# Patient Record
Sex: Female | Born: 1945 | Race: White | Hispanic: No | Marital: Single | Smoking: Former smoker
Health system: Southern US, Community
[De-identification: ages and names within clinical notes are randomized; demographics above are authoritative.]

## PROBLEM LIST (undated history)

## (undated) DIAGNOSIS — M1990 Unspecified osteoarthritis, unspecified site: Secondary | ICD-10-CM

## (undated) DIAGNOSIS — E785 Hyperlipidemia, unspecified: Secondary | ICD-10-CM

## (undated) DIAGNOSIS — M8580 Other specified disorders of bone density and structure, unspecified site: Secondary | ICD-10-CM

## (undated) DIAGNOSIS — C801 Malignant (primary) neoplasm, unspecified: Secondary | ICD-10-CM

## (undated) DIAGNOSIS — I10 Essential (primary) hypertension: Secondary | ICD-10-CM

## (undated) DIAGNOSIS — T7840XA Allergy, unspecified, initial encounter: Secondary | ICD-10-CM

## (undated) HISTORY — PX: POLYPECTOMY: SHX149

## (undated) HISTORY — DX: Other specified disorders of bone density and structure, unspecified site: M85.80

## (undated) HISTORY — DX: Essential (primary) hypertension: I10

## (undated) HISTORY — PX: CATARACT EXTRACTION, BILATERAL: SHX1313

## (undated) HISTORY — DX: Hyperlipidemia, unspecified: E78.5

## (undated) HISTORY — DX: Unspecified osteoarthritis, unspecified site: M19.90

## (undated) HISTORY — DX: Allergy, unspecified, initial encounter: T78.40XA

## (undated) HISTORY — DX: Malignant (primary) neoplasm, unspecified: C80.1

## (undated) HISTORY — PX: BASAL CELL CARCINOMA EXCISION: SHX1214

---

## 2005-08-22 HISTORY — PX: OTHER SURGICAL HISTORY: SHX169

## 2006-05-01 ENCOUNTER — Ambulatory Visit: Payer: Self-pay | Admitting: Family Medicine

## 2006-05-18 ENCOUNTER — Other Ambulatory Visit: Admission: RE | Admit: 2006-05-18 | Discharge: 2006-05-18 | Payer: Self-pay | Admitting: Family Medicine

## 2006-05-18 ENCOUNTER — Ambulatory Visit: Payer: Self-pay | Admitting: Family Medicine

## 2006-06-22 ENCOUNTER — Ambulatory Visit: Payer: Self-pay | Admitting: Family Medicine

## 2006-08-25 ENCOUNTER — Ambulatory Visit: Payer: Self-pay | Admitting: Family Medicine

## 2006-08-25 LAB — CONVERTED CEMR LAB
ALT: 40 units/L (ref 0–40)
AST: 28 units/L (ref 0–37)
HDL: 31.9 mg/dL — ABNORMAL LOW (ref >39.0)
Triglyceride fasting, serum: 261 mg/dL (ref 0–149)
VLDL: 52 mg/dL — ABNORMAL HIGH (ref 0–40)

## 2006-08-28 ENCOUNTER — Encounter: Admission: RE | Admit: 2006-08-28 | Discharge: 2006-08-28 | Payer: Self-pay | Admitting: Family Medicine

## 2006-11-24 ENCOUNTER — Ambulatory Visit: Payer: Self-pay | Admitting: Family Medicine

## 2006-11-24 LAB — CONVERTED CEMR LAB
ALT: 23 units/L (ref 0–40)
AST: 21 units/L (ref 0–37)
Alkaline Phosphatase: 40 units/L (ref 39–117)
Cholesterol: 138 mg/dL (ref 0–200)
LDL Cholesterol: 78 mg/dL (ref 0–99)
Total Protein: 6.5 g/dL (ref 6.0–8.3)

## 2006-12-01 ENCOUNTER — Ambulatory Visit: Payer: Self-pay | Admitting: Family Medicine

## 2007-04-17 DIAGNOSIS — M899 Disorder of bone, unspecified: Secondary | ICD-10-CM | POA: Insufficient documentation

## 2007-04-17 DIAGNOSIS — I10 Essential (primary) hypertension: Secondary | ICD-10-CM | POA: Insufficient documentation

## 2007-04-17 DIAGNOSIS — M949 Disorder of cartilage, unspecified: Secondary | ICD-10-CM

## 2007-04-17 DIAGNOSIS — E785 Hyperlipidemia, unspecified: Secondary | ICD-10-CM | POA: Insufficient documentation

## 2007-05-21 ENCOUNTER — Ambulatory Visit: Payer: Self-pay | Admitting: Family Medicine

## 2007-05-21 LAB — CONVERTED CEMR LAB
ALT: 18 units/L (ref 0–35)
Alkaline Phosphatase: 34 units/L — ABNORMAL LOW (ref 39–117)
Bilirubin, Direct: 0.2 mg/dL (ref 0.0–0.3)
CO2: 31 meq/L (ref 19–32)
Cholesterol: 155 mg/dL (ref 0–200)
Eosinophils Absolute: 0.1 10*3/uL (ref 0.0–0.6)
Eosinophils Relative: 0.7 % (ref 0.0–5.0)
GFR calc Af Amer: 93 mL/min
Glucose, Bld: 95 mg/dL (ref 70–99)
HDL: 43.3 mg/dL (ref >39.0)
Hemoglobin: 12.9 g/dL (ref 12.0–15.0)
LDL Cholesterol: 99 mg/dL (ref 0–99)
Lymphocytes Relative: 36.8 % (ref 12.0–46.0)
MCHC: 35.7 g/dL (ref 30.0–36.0)
MCV: 88.4 fL (ref 78.0–100.0)
Monocytes Relative: 8.3 % (ref 3.0–11.0)
Neutro Abs: 4.1 10*3/uL (ref 1.4–7.7)
Neutrophils Relative %: 53.2 % (ref 43.0–77.0)
Potassium: 4.5 meq/L (ref 3.5–5.1)
RDW: 11.3 % — ABNORMAL LOW (ref 11.5–14.6)
Sodium: 142 meq/L (ref 135–145)
Total Bilirubin: 0.8 mg/dL (ref 0.3–1.2)
Total Protein: 6.3 g/dL (ref 6.0–8.3)

## 2007-05-28 ENCOUNTER — Encounter: Payer: Self-pay | Admitting: Internal Medicine

## 2007-05-28 ENCOUNTER — Ambulatory Visit: Payer: Self-pay | Admitting: Family Medicine

## 2007-05-28 DIAGNOSIS — Z85828 Personal history of other malignant neoplasm of skin: Secondary | ICD-10-CM | POA: Insufficient documentation

## 2007-05-30 LAB — CONVERTED CEMR LAB: Vit D, 1,25-Dihydroxy: 19 — ABNORMAL LOW (ref 30–89)

## 2007-07-30 ENCOUNTER — Ambulatory Visit: Payer: Self-pay | Admitting: Family Medicine

## 2007-08-31 ENCOUNTER — Encounter: Admission: RE | Admit: 2007-08-31 | Discharge: 2007-08-31 | Payer: Self-pay | Admitting: Family Medicine

## 2007-12-06 ENCOUNTER — Ambulatory Visit: Payer: Self-pay | Admitting: Family Medicine

## 2007-12-10 LAB — CONVERTED CEMR LAB
ALT: 30 units/L (ref 0–35)
Bilirubin, Direct: 0.1 mg/dL (ref 0.0–0.3)
HDL: 33.3 mg/dL — ABNORMAL LOW (ref >39.0)
Total Bilirubin: 0.6 mg/dL (ref 0.3–1.2)
Total CHOL/HDL Ratio: 5.4
VLDL: 33 mg/dL (ref 0–40)

## 2008-05-27 ENCOUNTER — Ambulatory Visit: Payer: Self-pay | Admitting: Family Medicine

## 2008-05-27 LAB — CONVERTED CEMR LAB
Bilirubin Urine: NEGATIVE
Glucose, Urine, Semiquant: NEGATIVE
Specific Gravity, Urine: 1.02
Urobilinogen, UA: 0.2

## 2008-05-29 LAB — CONVERTED CEMR LAB
ALT: 33 units/L (ref 0–35)
AST: 28 units/L (ref 0–37)
Alkaline Phosphatase: 39 units/L (ref 39–117)
Basophils Absolute: 0.1 10*3/uL (ref 0.0–0.1)
Bilirubin, Direct: 0.1 mg/dL (ref 0.0–0.3)
CO2: 26 meq/L (ref 19–32)
Cholesterol: 161 mg/dL (ref 0–200)
Creatinine, Ser: 0.8 mg/dL (ref 0.4–1.2)
HDL: 33.6 mg/dL — ABNORMAL LOW (ref >39.0)
Hemoglobin: 13.3 g/dL (ref 12.0–15.0)
LDL Cholesterol: 90 mg/dL (ref 0–99)
Lymphocytes Relative: 37.1 % (ref 12.0–46.0)
MCHC: 35.4 g/dL (ref 30.0–36.0)
Monocytes Relative: 7.2 % (ref 3.0–12.0)
Neutro Abs: 3.6 10*3/uL (ref 1.4–7.7)
Platelets: 367 10*3/uL (ref 150–400)
Potassium: 3.8 meq/L (ref 3.5–5.1)
RDW: 11.5 % (ref 11.5–14.6)
Sodium: 141 meq/L (ref 135–145)
Total Bilirubin: 0.5 mg/dL (ref 0.3–1.2)
Total CHOL/HDL Ratio: 4.8
Triglycerides: 185 mg/dL — ABNORMAL HIGH (ref 0–149)
VLDL: 37 mg/dL (ref 0–40)

## 2008-06-03 ENCOUNTER — Ambulatory Visit: Payer: Self-pay | Admitting: Family Medicine

## 2008-06-03 ENCOUNTER — Encounter: Payer: Self-pay | Admitting: Family Medicine

## 2008-06-03 ENCOUNTER — Other Ambulatory Visit: Admission: RE | Admit: 2008-06-03 | Discharge: 2008-06-03 | Payer: Self-pay | Admitting: Family Medicine

## 2008-06-10 ENCOUNTER — Ambulatory Visit: Payer: Self-pay | Admitting: Internal Medicine

## 2008-06-10 ENCOUNTER — Encounter: Payer: Self-pay | Admitting: Family Medicine

## 2008-09-03 ENCOUNTER — Encounter: Admission: RE | Admit: 2008-09-03 | Discharge: 2008-09-03 | Payer: Self-pay | Admitting: Family Medicine

## 2008-12-01 ENCOUNTER — Ambulatory Visit: Payer: Self-pay | Admitting: Family Medicine

## 2008-12-01 DIAGNOSIS — J309 Allergic rhinitis, unspecified: Secondary | ICD-10-CM | POA: Insufficient documentation

## 2008-12-01 DIAGNOSIS — E559 Vitamin D deficiency, unspecified: Secondary | ICD-10-CM | POA: Insufficient documentation

## 2008-12-03 ENCOUNTER — Encounter: Payer: Self-pay | Admitting: Family Medicine

## 2008-12-03 LAB — CONVERTED CEMR LAB
Cholesterol: 142 mg/dL (ref 0–200)
HDL: 34.9 mg/dL — ABNORMAL LOW (ref >39.00)
Total CHOL/HDL Ratio: 4
Triglycerides: 118 mg/dL (ref 0.0–149.0)
Vit D, 25-Hydroxy: 42 ng/mL (ref 30–89)

## 2009-03-03 ENCOUNTER — Telehealth: Payer: Self-pay | Admitting: Family Medicine

## 2009-06-01 ENCOUNTER — Ambulatory Visit: Payer: Self-pay | Admitting: Family Medicine

## 2009-06-01 LAB — CONVERTED CEMR LAB
Bilirubin Urine: NEGATIVE
Blood in Urine, dipstick: NEGATIVE
Glucose, Urine, Semiquant: NEGATIVE
Ketones, urine, test strip: NEGATIVE
Nitrite: NEGATIVE
Protein, U semiquant: NEGATIVE
Specific Gravity, Urine: 1.015
Urobilinogen, UA: 0.2
WBC Urine, dipstick: NEGATIVE
pH: 7

## 2009-06-02 LAB — CONVERTED CEMR LAB: Vit D, 25-Hydroxy: 29 ng/mL — ABNORMAL LOW (ref 30–89)

## 2009-06-03 LAB — CONVERTED CEMR LAB
Albumin: 4.1 g/dL (ref 3.5–5.2)
BUN: 15 mg/dL (ref 6–23)
Basophils Relative: 0.9 % (ref 0.0–3.0)
CO2: 23 meq/L (ref 19–32)
Calcium: 9.4 mg/dL (ref 8.4–10.5)
Creatinine, Ser: 0.9 mg/dL (ref 0.4–1.2)
Eosinophils Absolute: 0.1 10*3/uL (ref 0.0–0.7)
GFR calc non Af Amer: 66.97 mL/min (ref >60)
Glucose, Bld: 88 mg/dL (ref 70–99)
HDL: 30.4 mg/dL — ABNORMAL LOW (ref >39.00)
Lymphocytes Relative: 37.2 % (ref 12.0–46.0)
Lymphs Abs: 2.5 10*3/uL (ref 0.7–4.0)
MCHC: 34.9 g/dL (ref 30.0–36.0)
MCV: 92.9 fL (ref 78.0–100.0)
Monocytes Absolute: 0.6 10*3/uL (ref 0.1–1.0)
Neutro Abs: 3.4 10*3/uL (ref 1.4–7.7)
Neutrophils Relative %: 51.3 % (ref 43.0–77.0)
RBC: 4.29 M/uL (ref 3.87–5.11)
RDW: 11.5 % (ref 11.5–14.6)
TSH: 1.29 microintl units/mL (ref 0.35–5.50)
Total Protein: 6.7 g/dL (ref 6.0–8.3)
Triglycerides: 196 mg/dL — ABNORMAL HIGH (ref 0.0–149.0)
VLDL: 39.2 mg/dL (ref 0.0–40.0)

## 2009-06-05 ENCOUNTER — Ambulatory Visit: Payer: Self-pay | Admitting: Family Medicine

## 2009-09-10 ENCOUNTER — Encounter: Admission: RE | Admit: 2009-09-10 | Discharge: 2009-09-10 | Payer: Self-pay | Admitting: Family Medicine

## 2009-12-04 ENCOUNTER — Ambulatory Visit: Payer: Self-pay | Admitting: Family Medicine

## 2009-12-07 LAB — CONVERTED CEMR LAB
ALT: 35 units/L (ref 0–35)
AST: 27 units/L (ref 0–37)
Alkaline Phosphatase: 38 units/L — ABNORMAL LOW (ref 39–117)
Cholesterol: 147 mg/dL (ref 0–200)
HDL: 38.5 mg/dL — ABNORMAL LOW (ref >39.00)
Total CHOL/HDL Ratio: 4
Triglycerides: 167 mg/dL — ABNORMAL HIGH (ref 0.0–149.0)

## 2010-06-07 ENCOUNTER — Encounter: Payer: Self-pay | Admitting: Family Medicine

## 2010-06-07 ENCOUNTER — Ambulatory Visit: Payer: Self-pay | Admitting: Family Medicine

## 2010-06-07 DIAGNOSIS — G43909 Migraine, unspecified, not intractable, without status migrainosus: Secondary | ICD-10-CM | POA: Insufficient documentation

## 2010-06-10 LAB — CONVERTED CEMR LAB
BUN: 21 mg/dL (ref 6–23)
Basophils Absolute: 0 10*3/uL (ref 0.0–0.1)
Calcium: 10.1 mg/dL (ref 8.4–10.5)
Cholesterol: 152 mg/dL (ref 0–200)
Creatinine, Ser: 0.8 mg/dL (ref 0.4–1.2)
Eosinophils Absolute: 0.1 10*3/uL (ref 0.0–0.7)
Eosinophils Relative: 1.4 % (ref 0.0–5.0)
GFR calc non Af Amer: 79.93 mL/min (ref >60)
Glucose, Bld: 104 mg/dL — ABNORMAL HIGH (ref 70–99)
HCT: 39.2 % (ref 36.0–46.0)
HDL: 37 mg/dL — ABNORMAL LOW (ref >39.00)
LDL Cholesterol: 88 mg/dL (ref 0–99)
Lymphocytes Relative: 33.2 % (ref 12.0–46.0)
Lymphs Abs: 2.8 10*3/uL (ref 0.7–4.0)
Monocytes Absolute: 0.6 10*3/uL (ref 0.1–1.0)
Monocytes Relative: 7.3 % (ref 3.0–12.0)
Neutrophils Relative %: 57.6 % (ref 43.0–77.0)
Platelets: 337 10*3/uL (ref 150.0–400.0)
RDW: 12.4 % (ref 11.5–14.6)
Total Bilirubin: 0.5 mg/dL (ref 0.3–1.2)
Total Protein: 6.6 g/dL (ref 6.0–8.3)
Triglycerides: 134 mg/dL (ref 0.0–149.0)
VLDL: 26.8 mg/dL (ref 0.0–40.0)
WBC: 8.5 10*3/uL (ref 4.5–10.5)

## 2010-09-14 ENCOUNTER — Encounter
Admission: RE | Admit: 2010-09-14 | Discharge: 2010-09-14 | Payer: Self-pay | Source: Home / Self Care | Attending: Family Medicine | Admitting: Family Medicine

## 2010-09-21 NOTE — Assessment & Plan Note (Signed)
Summary: emp/pap if needed/pt coming in fasting/cjr   Vital Signs:  Patient profile:   65 year old female Menstrual status:  postmenopausal Height:      67 inches Weight:      168 pounds O2 Sat:      98 % Temp:     98.4 degrees F Pulse rate:   82 / minute BP sitting:   130 / 86  (left arm) Cuff size:   regular  Vitals Entered By: Pura Spice, RN (June 07, 2010 9:20 AM) CC: cpx no pap      Menstrual Status postmenopausal Last PAP Result NEGATIVE FOR INTRAEPITHELIAL LESIONS OR MALIGNANCY.   History of Present Illness: 65 yr old female for a cpx. She feels well in general. She admits to not exercising as much as before and to gaining some weight.   Preventive Screening-Counseling & Management  Alcohol-Tobacco     Smoking Status: quit > 6 months     Year Quit: 1980  Allergies (verified): No Known Drug Allergies  Past History:  Past Medical History: Hyperlipidemia Hypertension Osteopenia, last DEXA on 06-10-08 sees Dr. Para Skeans for skin checks migraine HAs Vit. D deficiency  Past Surgical History: Reviewed history from 06/03/2008 and no changes required. basal cell removed from face 2001 basal cell removed from back  2007 colonoscopy 12-15-05 in [place], [place], repeat in 5 yrs  Family History: Reviewed history from 05/28/2007 and no changes required. Family History of Arthritis Family History of CAD Female 1st degree relative <50 Family History Diabetes 1st degree relative Family History Hypertension Family History Lung cancer Family History Osteoporosis brother died of CHF  Social History: Reviewed history from 05/28/2007 and no changes required. Retired Single Never Smoked Alcohol use-yes Smoking Status:  quit > 6 months  Review of Systems  The patient denies anorexia, fever, weight loss, vision loss, decreased hearing, hoarseness, chest pain, syncope, dyspnea on exertion, peripheral edema, prolonged cough, headaches, hemoptysis, abdominal pain,  melena, hematochezia, severe indigestion/heartburn, hematuria, incontinence, genital sores, muscle weakness, suspicious skin lesions, transient blindness, difficulty walking, depression, unusual weight change, abnormal bleeding, enlarged lymph nodes, angioedema, breast masses, and testicular masses.         Flu Vaccine Consent Questions     Do you have a history of severe allergic reactions to this vaccine? no    Any prior history of allergic reactions to egg and/or gelatin? no    Do you have a sensitivity to the preservative Thimersol? no    Do you have a past history of Guillan-Barre Syndrome? no    Do you currently have an acute febrile illness? no    Have you ever had a severe reaction to latex? no    Vaccine information given and explained to patient? yes    Are you currently pregnant? no    Lot Number:[id number]   Exp Date:02/19/2011   Site Given  Left Deltoid IM Pura Spice, RN  June 07, 2010 9:30 AM   Physical Exam  General:  overweight-appearing.   Head:  Normocephalic and atraumatic without obvious abnormalities. No apparent alopecia or balding. Eyes:  No corneal or conjunctival inflammation noted. EOMI. Perrla. Funduscopic exam benign, without hemorrhages, exudates or papilledema. Vision grossly normal. Ears:  External ear exam shows no significant lesions or deformities.  Otoscopic examination reveals clear canals, tympanic membranes are intact bilaterally without bulging, retraction, inflammation or discharge. Hearing is grossly normal bilaterally. Nose:  External nasal examination shows no deformity or inflammation. Nasal mucosa are pink  and moist without lesions or exudates. Mouth:  Oral mucosa and oropharynx without lesions or exudates.  Teeth in good repair. Neck:  No deformities, masses, or tenderness noted. Chest Wall:  No deformities, masses, or tenderness noted. Breasts:  No mass, nodules, thickening, tenderness, bulging, retraction, inflamation, nipple discharge  or skin changes noted.   Lungs:  Normal respiratory effort, chest expands symmetrically. Lungs are clear to auscultation, no crackles or wheezes. Heart:  Normal rate and regular rhythm. S1 and S2 normal without gallop, murmur, click, rub or other extra sounds. EKG  normal Abdomen:  Bowel sounds positive,abdomen soft and non-tender without masses, organomegaly or hernias noted. Rectal:  No external abnormalities noted. Normal sphincter tone. No rectal masses or tenderness. Heme neg.  Genitalia:  normal introitus and no external lesions.   Msk:  No deformity or scoliosis noted of thoracic or lumbar spine.   Pulses:  R and L carotid,radial,femoral,dorsalis pedis and posterior tibial pulses are full and equal bilaterally Extremities:  No clubbing, cyanosis, edema, or deformity noted with normal full range of motion of all joints.   Neurologic:  No cranial nerve deficits noted. Station and gait are normal. Plantar reflexes are down-going bilaterally. DTRs are symmetrical throughout. Sensory, motor and coordinative functions appear intact. Skin:  Intact without suspicious lesions or rashes Cervical Nodes:  No lymphadenopathy noted Axillary Nodes:  No palpable lymphadenopathy Inguinal Nodes:  No significant adenopathy Psych:  Cognition and judgment appear intact. Alert and cooperative with normal attention span and concentration. No apparent delusions, illusions, hallucinations   Impression & Recommendations:  Problem # 1:  VITAMIN D DEFICIENCY (ICD-268.9)  Orders: T-Vitamin D (25-Hydroxy) (14782-95621)  Problem # 2:  HYPERTENSION (ICD-401.9)  Her updated medication list for this problem includes:    Maxzide-25 37.5-25 Mg Tabs (Triamterene-hctz) .Marland Kitchen... Take 1 tablet by mouth once a day  Orders: EKG w/ Interpretation (93000) Venipuncture (30865) TLB-Lipid Panel (80061-LIPID) TLB-BMP (Basic Metabolic Panel-BMET) (80048-METABOL) TLB-CBC Platelet - w/Differential (85025-CBCD) TLB-Hepatic/Liver  Function Pnl (80076-HEPATIC) TLB-TSH (Thyroid Stimulating Hormone) (84443-TSH) UA Dipstick w/Micro (automated) (81001)  Problem # 3:  HYPERLIPIDEMIA (ICD-272.4)  Her updated medication list for this problem includes:    Zocor 40 Mg Tabs (Simvastatin) .Marland Kitchen... Take 1 tablet by mouth once a day    Fenofibrate 160 Mg Tabs (Fenofibrate) .Marland Kitchen... 1 by mouth once daily  Problem # 4:  MIGRAINE HEADACHE (ICD-346.90)  The following medications were removed from the medication list:    Midrin 325-65-100 Mg Caps (Apap-isometheptene-dichloral) .Marland Kitchen... As needed for ha    Tylenol Extra Strength 500 Mg Tabs (Acetaminophen) .Marland Kitchen... As needed Her updated medication list for this problem includes:    Aspirin 81 Mg Tbec (Aspirin) ..... Once daily    Imitrex 100 Mg Tabs (Sumatriptan succinate) .Marland Kitchen... As needed for ha  Problem # 5:  OSTEOPENIA (ICD-733.90)  The following medications were removed from the medication list:    Fosamax 70 Mg Tabs (Alendronate sodium) ..... Every week    Vitamin D (ergocalciferol) 50000 Unit Caps (Ergocalciferol) ..... One q week Her updated medication list for this problem includes:    Caltrate 600+d 600-400 Mg-unit Tabs (Calcium carbonate-vitamin d) .Marland Kitchen... Take 1 tablet by mouth two times a day  Complete Medication List: 1)  Maxzide-25 37.5-25 Mg Tabs (Triamterene-hctz) .... Take 1 tablet by mouth once a day 2)  Multivitamins Caps (Multiple vitamin) .... Once daily 3)  Caltrate 600+d 600-400 Mg-unit Tabs (Calcium carbonate-vitamin d) .... Take 1 tablet by mouth two times a day 4)  Aspirin  81 Mg Tbec (Aspirin) .... Once daily 5)  Zocor 40 Mg Tabs (Simvastatin) .... Take 1 tablet by mouth once a day 6)  Fenofibrate 160 Mg Tabs (Fenofibrate) .Marland Kitchen.. 1 by mouth once daily 7)  Glucosamine-chondroitin 1500-1200 Mg/41ml Liqd (Glucosamine-chondroitin) .... Take 1 tablet by mouth once a day 8)  Bl Vitamin C 500 Mg Tabs (Ascorbic acid) .Marland Kitchen.. 1 by mouth two times a day 9)  Fish Oil Oil (Fish oil)  .... Once daily 10)  Imitrex 100 Mg Tabs (Sumatriptan succinate) .... As needed for ha  Other Orders: Flu Vaccine 31yrs + MEDICARE PATIENTS (Z6109) Administration Flu vaccine - MCR (U0454)  Patient Instructions: 1)  get fasting labs. We will stop Fosamax since she has been on it for 5 years. Since Midrin is no longer available, we will try Imitrex.  2)  It is important that you exercise reguarly at least 20 minutes 5 times a week. If you develop chest pain, have severe difficulty breathing, or feel very tired, stop exercising immediately and seek medical attention.  3)  You need to lose weight. Consider a lower calorie diet and regular exercise.  Prescriptions: IMITREX 100 MG TABS (SUMATRIPTAN SUCCINATE) as needed for HA  #36 x 3   Entered and Authorized by:   Nelwyn Salisbury MD   Signed by:   Nelwyn Salisbury MD on 06/07/2010   Method used:   Electronically to        [facility]  [phone]* (retail)       [address] [place], [place]  [address]       Ph: [phone] or [phone]       Fax: [phone]   RxID:   [id number] FENOFIBRATE 160 MG TABS (FENOFIBRATE) 1 by mouth once daily  #90 x 3   Entered and Authorized by:   Nelwyn Salisbury MD   Signed by:   Nelwyn Salisbury MD on 06/07/2010   Method used:   Electronically to        [facility]  [phone]* (retail)       [address] [place], [place]  [address]       Ph: [phone] or [phone]       Fax: [phone]   RxID:   [id number] ZOCOR 40 MG  TABS (SIMVASTATIN) Take 1 tablet by mouth once a day  #90 x 3   Entered and Authorized by:   Nelwyn Salisbury MD   Signed by:   Nelwyn Salisbury MD on 06/07/2010   Method used:   Electronically to        [facility]  [phone]* (retail)       [address] [place], [place]  [address]       Ph: [phone] or [phone]       Fax: [phone]   RxID:   [id number] MAXZIDE-25 37.5-25 MG  TABS (TRIAMTERENE-HCTZ) Take 1 tablet by mouth once a  day  #90 x 3   Entered and Authorized by:   Nelwyn Salisbury MD   Signed by:   Nelwyn Salisbury MD on 06/07/2010   Method used:   Electronically to        [facility]  [phone]* (retail)       [address] [place], [place]  [address]  Ph: [phone] or [phone]       Fax: [phone]   RxID:   [id number]    Orders Added: 1)  Flu Vaccine 38yrs + MEDICARE PATIENTS [Q2039] 2)  Administration Flu vaccine - MCR [G0008] 3)  Est. Patient Level IV [72536] 4)  EKG w/ Interpretation [93000] 5)  Venipuncture [36415] 6)  TLB-Lipid Panel [80061-LIPID] 7)  TLB-BMP (Basic Metabolic Panel-BMET) [80048-METABOL] 8)  TLB-CBC Platelet - w/Differential [85025-CBCD] 9)  TLB-Hepatic/Liver Function Pnl [80076-HEPATIC] 10)  TLB-TSH (Thyroid Stimulating Hormone) [84443-TSH] 11)  T-Vitamin D (25-Hydroxy) [64403-47425] 12)  UA Dipstick w/Micro (automated) [81001]      Eye Exam  Dr Salvatore Marvel last exam 2010  Appended Document: Orders Update    Clinical Lists Changes  Orders: Added new Service order of Specimen Handling (95638) - Signed      Appended Document: emp/pap if needed/pt coming in fasting/cjr  Laboratory Results   Urine Tests    Routine Urinalysis   Color: yellow Appearance: Clear Glucose: negative   (Normal Range: Negative) Bilirubin: negative   (Normal Range: Negative) Ketone: negative   (Normal Range: Negative) Spec. Gravity: 1.015   (Normal Range: 1.003-1.035) Blood: trace-intact   (Normal Range: Negative) pH: 7.0   (Normal Range: 5.0-8.0) Protein: negative   (Normal Range: Negative) Urobilinogen: 0.2   (Normal Range: 0-1) Nitrite: negative   (Normal Range: Negative) Leukocyte Esterace: negative   (Normal Range: Negative)    Comments: Rita Ohara  June 07, 2010 11:29 AM

## 2010-09-21 NOTE — Assessment & Plan Note (Signed)
Summary: 6 month rov/pt will come fasting/njr   Vital Signs:  Patient profile:   65 year old female Weight:      174 pounds BMI:     29.97 Temp:     98.4 degrees F oral BP sitting:   150 / 76  (left arm) Cuff size:   regular  Vitals Entered By: Raechel Ache, RN (December 04, 2009 9:48 AM) CC: ROV, fasting.   History of Present Illness: Here to recheck her BP and to get labs. She has been walking 2-3 miles a day about 5 days a week. She admits to some dietary lapses however. her weight has stayed the same. She feels good.   Allergies: No Known Drug Allergies  Past History:  Past Medical History: Reviewed history from 06/03/2008 and no changes required. Hyperlipidemia Hypertension Osteopenia, last DEXA on 05-12-05  sees Dr. Para Skeans for skin checks migraine HAs Vit. D deficiency  Review of Systems  The patient denies anorexia, fever, weight loss, weight gain, vision loss, decreased hearing, hoarseness, chest pain, syncope, dyspnea on exertion, peripheral edema, prolonged cough, headaches, hemoptysis, abdominal pain, melena, hematochezia, severe indigestion/heartburn, hematuria, incontinence, genital sores, muscle weakness, suspicious skin lesions, transient blindness, difficulty walking, depression, unusual weight change, abnormal bleeding, enlarged lymph nodes, angioedema, breast masses, and testicular masses.    Physical Exam  General:  Well-developed,well-nourished,in no acute distress; alert,appropriate and cooperative throughout examination Neck:  No deformities, masses, or tenderness noted. Lungs:  Normal respiratory effort, chest expands symmetrically. Lungs are clear to auscultation, no crackles or wheezes. Heart:  Normal rate and regular rhythm. S1 and S2 normal without gallop, murmur, click, rub or other extra sounds.   Impression & Recommendations:  Problem # 1:  VITAMIN D DEFICIENCY (ICD-268.9)  Orders: T-Vitamin D (25-Hydroxy) (09811-91478)  Problem #  2:  HYPERTENSION (ICD-401.9)  Her updated medication list for this problem includes:    Maxzide-25 37.5-25 Mg Tabs (Triamterene-hctz) .Marland Kitchen... Take 1 tablet by mouth once a day  Problem # 3:  HYPERLIPIDEMIA (ICD-272.4)  Her updated medication list for this problem includes:    Zocor 40 Mg Tabs (Simvastatin) .Marland Kitchen... Take 1 tablet by mouth once a day    Fenofibrate 160 Mg Tabs (Fenofibrate) .Marland Kitchen... 1 by mouth once daily  Orders: Venipuncture (29562) TLB-Lipid Panel (80061-LIPID) TLB-Hepatic/Liver Function Pnl (80076-HEPATIC)  Complete Medication List: 1)  Maxzide-25 37.5-25 Mg Tabs (Triamterene-hctz) .... Take 1 tablet by mouth once a day 2)  Fosamax 70 Mg Tabs (Alendronate sodium) .... Every week 3)  Multivitamins Caps (Multiple vitamin) .... Once daily 4)  Caltrate 600+d 600-400 Mg-unit Tabs (Calcium carbonate-vitamin d) .... Take 1 tablet by mouth two times a day 5)  Aspirin 81 Mg Tbec (Aspirin) .... Once daily 6)  Zocor 40 Mg Tabs (Simvastatin) .... Take 1 tablet by mouth once a day 7)  Fenofibrate 160 Mg Tabs (Fenofibrate) .Marland Kitchen.. 1 by mouth once daily 8)  Midrin 325-65-100 Mg Caps (Apap-isometheptene-dichloral) .... As needed for ha 9)  Tylenol Extra Strength 500 Mg Tabs (Acetaminophen) .... As needed 10)  Tylenol Pm Extra Strength 500-25 Mg Tabs (Diphenhydramine-apap (sleep)) .... 4x week as needed 11)  Glucosamine-chondroitin 1500-1200 Mg/22ml Liqd (Glucosamine-chondroitin) .... Take 1 tablet by mouth once a day 12)  Bl Vitamin C 500 Mg Tabs (Ascorbic acid) .Marland Kitchen.. 1 by mouth two times a day 13)  Fish Oil Oil (Fish oil) .... Once daily 14)  Vitamin D (ergocalciferol) 50000 Unit Caps (Ergocalciferol) .... One q week  Patient Instructions: 1)  get labs  today

## 2010-11-09 ENCOUNTER — Encounter: Payer: Self-pay | Admitting: Family Medicine

## 2010-12-07 ENCOUNTER — Encounter: Payer: Self-pay | Admitting: Family Medicine

## 2010-12-07 ENCOUNTER — Ambulatory Visit (INDEPENDENT_AMBULATORY_CARE_PROVIDER_SITE_OTHER): Payer: Medicare Other | Admitting: Family Medicine

## 2010-12-07 VITALS — BP 120/82 | HR 82 | Temp 98.1°F | Wt 175.0 lb

## 2010-12-07 DIAGNOSIS — I10 Essential (primary) hypertension: Secondary | ICD-10-CM

## 2010-12-07 DIAGNOSIS — E785 Hyperlipidemia, unspecified: Secondary | ICD-10-CM

## 2010-12-07 LAB — HEPATIC FUNCTION PANEL
Bilirubin, Direct: 0.1 mg/dL (ref 0.0–0.3)
Total Bilirubin: 0.4 mg/dL (ref 0.3–1.2)

## 2010-12-07 LAB — LIPID PANEL
Cholesterol: 142 mg/dL (ref 0–200)
LDL Cholesterol: 83 mg/dL (ref 0–99)
Triglycerides: 126 mg/dL (ref 0.0–149.0)

## 2010-12-07 NOTE — Progress Notes (Signed)
  Subjective:    Patient ID: Rachael Mitchell, female    DOB: 08-26-1945, 65 y.o.   MRN: [id number]  HPI Here to recheck her BP and lipids. She feels good, she is watching her diet, and she is walking. Her HAs are stable as well.    Review of Systems  Constitutional: Negative.   Respiratory: Negative.   Cardiovascular: Negative.        Objective:   Physical Exam  Constitutional: She appears well-developed and well-nourished.  Cardiovascular: Normal rate, regular rhythm, normal heart sounds and intact distal pulses.   Pulmonary/Chest: Effort normal and breath sounds normal.          Assessment & Plan:  Get fasting labs today

## 2010-12-09 ENCOUNTER — Telehealth: Payer: Self-pay

## 2010-12-09 NOTE — Telephone Encounter (Signed)
Pt aware.

## 2010-12-09 NOTE — Telephone Encounter (Signed)
Message copied by Madison Hickman on Thu Dec 09, 2010 11:43 AM ------      Message from: Dwaine Deter      Created: Thu Dec 09, 2010  8:12 AM       normal

## 2011-01-07 NOTE — Assessment & Plan Note (Signed)
[facility] OFFICE NOTE   Rachael Mitchell, Rachael Mitchell                         MRN:          [id number]  DATE:05/18/2006                            DOB:          June 16, 1946    This is a 65 year old woman here to established with our practice.  She is  also for a complete physical examination.  She has a couple of concerns.  First off, she is interested in getting a pneumonia shot, which she has  never received.  She also has had progressive hearing loss over the last few  years.  It has been recommended that she see an audiologist, and she would  like to meet one here.  She moved to [facility] from [place], [place]  about 9 months ago, and is now establishing with [place].   PAST MEDICAL HISTORY:  Somewhat complicated.  1. She has degenerative arthritis.  2. She has had chicken pox.  3. She has migraine headaches.  4. She has acid reflux disease.  5. She has hypertension.  6. She has osteopenia, which was diagnosed on a DEXA scan originally about      3 years ago.  Of note, she was on hormone replacement from age [age],      then it was stopped.  She was on Evista when the osteopenia was first      diagnosed.  She has now been on Fosamax for the past year.  Her last      DEXA scan was in September of 2006 and confirmed stable osteopenia.  7. She has hyperlipidemia, but has never been on medication for it.  8. She has a history of some skin cancer.  She has had a basal cell      removed from her face, and a basal cell cancer removed from her back.  9. She has a history of benign colon polyps.  Her last colonoscopy was in      April of 2007.  Several benign polyps were removed, and a 5-year      followup was recommended.   IMMUNIZATIONS:  She had a tetanus booster in September of 2006.  She does  get flu shots every fall.   She is a G0, P0.  She had one abnormal Pap smear while in her 62s.  She had  cryotherapy to  the cervix at that point and has had normal Pap smears ever  since.  She has always had normal mammograms.  She seems Dr. Para Skeans  here for dermatology care, and has seen Dr. Frederik Pear to have a skin  cancer removed.   ALLERGIES:  None.   CURRENT MEDICATIONS:  1. Maxzide 37.5/25 once a day.  2. Fosamax 70 mg once a week.  3. Multivitamins daily.  4. Calcium and vitamin D daily.  5  Aspirin 81 mg daily.  1. Glucosamine daily.  2. Chondroitin sulfate daily.  3. Midrin as needed for migraine headaches.   HABITS:  She drinks some alcohol.  She does not use tobacco.   SOCIAL HISTORY:  She is single.  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   FAMILY HISTORY:  Remarkable for osteoporosis in several women including her  mother.  Also diabetes, hypertension, heart disease, lung cancer, and  arthritis in her parents.   OBJECTIVE:  VITAL SIGNS:  Height 5 feet, 7 inches, weight 174, blood  pressure 112/78, pulse 84 and regular.  GENERAL:  She is a bit overweight.  SKIN:  Free of significant lesions.  HEENT:  Eyes clear.  Sclerae and oropharynx clear.  NECK:  Supple without lymphadenopathy or masses.  LUNGS:  Clear.  CARDIAC:  Rate and rhythm regular without gallops, murmurs or rubs.  Distal  pulses are full.  EKG is within normal limits.  BREASTS/AXILLAE:  Clear.  ABDOMEN:  Soft, normal bowel sounds, nontender, no masses.  PELVIC:  External genitalia within normal limits.  Vagina __________.  Pap  smear was obtained.  Uterus not enlarged.  Adnexa with no masses or  tenderness.  RECTAL:  No masses or tenderness.  Stool Hemoccult negative.  EXTREMITIES:  No clubbing, cyanosis, or edema.  NEUROLOGIC:  Grossly intact.   LABORATORY DATA:  She was here for fasting labs on May 01, 2006.  These were remarkable only for an abnormal lipid panel.  Triglycerides were  high at 346.  HDL was low at 34, and LDL was high at 157.    ASSESSMENT AND PLAN:  1. Complete physical.  We talked about increasing exercise and losing      weight.  She will also schedule a routine mammogram.  2. Hypertension, stable.  3. Osteopenia, stable.  4. Hearing loss.  Will refer her to audiology for testing.  5. Hyperlipidemia.  Will begin treatment with Zocor 40 mg once a day.      Will check another lipid panel in 3 months.  6. Migraine headaches, stable.  7. History of benign colon polyps, stable.  8. Immunization update.  She was given a pneumonia vaccine today.            ______________________________  Tera Mater. Clent Ridges, MD     SAF/MedQ  DD:  05/19/2006  DT:  05/20/2006  Job #:  [id number]

## 2011-06-09 ENCOUNTER — Ambulatory Visit (INDEPENDENT_AMBULATORY_CARE_PROVIDER_SITE_OTHER)
Admission: RE | Admit: 2011-06-09 | Discharge: 2011-06-09 | Disposition: A | Discharge status: Home / Self Care | Payer: Medicare Other | Source: Ambulatory Visit

## 2011-06-09 ENCOUNTER — Ambulatory Visit (INDEPENDENT_AMBULATORY_CARE_PROVIDER_SITE_OTHER): Payer: Medicare Other | Admitting: Family Medicine

## 2011-06-09 ENCOUNTER — Encounter: Payer: Self-pay | Admitting: Family Medicine

## 2011-06-09 VITALS — BP 120/84 | HR 74 | Temp 98.0°F | Ht 65.75 in | Wt 169.0 lb

## 2011-06-09 DIAGNOSIS — G43909 Migraine, unspecified, not intractable, without status migrainosus: Secondary | ICD-10-CM

## 2011-06-09 DIAGNOSIS — K635 Polyp of colon: Secondary | ICD-10-CM

## 2011-06-09 DIAGNOSIS — M810 Age-related osteoporosis without current pathological fracture: Secondary | ICD-10-CM

## 2011-06-09 DIAGNOSIS — I10 Essential (primary) hypertension: Secondary | ICD-10-CM

## 2011-06-09 DIAGNOSIS — Z136 Encounter for screening for cardiovascular disorders: Secondary | ICD-10-CM

## 2011-06-09 DIAGNOSIS — D126 Benign neoplasm of colon, unspecified: Secondary | ICD-10-CM

## 2011-06-09 DIAGNOSIS — Z23 Encounter for immunization: Secondary | ICD-10-CM

## 2011-06-09 LAB — CBC WITH DIFFERENTIAL/PLATELET
Basophils Relative: 0.7 % (ref 0.0–3.0)
Eosinophils Relative: 1.3 % (ref 0.0–5.0)
Hemoglobin: 13.8 g/dL (ref 12.0–15.0)
Lymphocytes Relative: 43.7 % (ref 12.0–46.0)
MCHC: 33.8 g/dL (ref 30.0–36.0)
Monocytes Relative: 7 % (ref 3.0–12.0)
Neutro Abs: 3.1 10*3/uL (ref 1.4–7.7)
Neutrophils Relative %: 47.3 % (ref 43.0–77.0)
RBC: 4.41 Mil/uL (ref 3.87–5.11)
WBC: 6.5 10*3/uL (ref 4.5–10.5)

## 2011-06-09 LAB — HEPATIC FUNCTION PANEL
ALT: 33 U/L (ref 0–35)
AST: 25 U/L (ref 0–37)
Alkaline Phosphatase: 56 U/L (ref 39–117)
Bilirubin, Direct: 0 mg/dL (ref 0.0–0.3)
Total Bilirubin: 0.6 mg/dL (ref 0.3–1.2)

## 2011-06-09 LAB — LIPID PANEL
LDL Cholesterol: 77 mg/dL (ref 0–99)
Total CHOL/HDL Ratio: 4

## 2011-06-09 LAB — POCT URINALYSIS DIPSTICK
Ketones, UA: NEGATIVE
Protein, UA: NEGATIVE
Spec Grav, UA: 1.02
pH, UA: 6.5

## 2011-06-09 LAB — BASIC METABOLIC PANEL
Chloride: 105 mEq/L (ref 96–112)
Potassium: 4.5 mEq/L (ref 3.5–5.1)

## 2011-06-09 MED ORDER — FENOFIBRATE 160 MG PO TABS: 160.0000 mg | ORAL_TABLET | Freq: Every day | ORAL | Status: DC

## 2011-06-09 MED ORDER — SIMVASTATIN 40 MG PO TABS: 40.0000 mg | ORAL_TABLET | Freq: Every day | ORAL | Status: DC

## 2011-06-09 MED ORDER — SUMATRIPTAN SUCCINATE 100 MG PO TABS: 100.0000 mg | ORAL_TABLET | Freq: Once | ORAL | Status: DC | PRN

## 2011-06-09 MED ORDER — TRIAMTERENE-HCTZ 37.5-25 MG PO TABS: 1.0000 | ORAL_TABLET | Freq: Every day | ORAL | Status: DC

## 2011-06-09 NOTE — Progress Notes (Signed)
  Subjective:    Patient ID: Rachael Mitchell, female    DOB: 04/17/1946, 65 y.o.   MRN: [id number]  HPI 65 yr old female for a cpx. She feels well and has no cocnerns. She is due for a colonosocpy this year. She is also due for another DEXA since her last one was 2 years ago. She had been on Fosamax for 5 years, then she stopped this one year ago. Review of Systems  Constitutional: Negative.   HENT: Negative.   Eyes: Negative.   Respiratory: Negative.   Cardiovascular: Negative.   Gastrointestinal: Negative.   Genitourinary: Negative for dysuria, urgency, frequency, hematuria, flank pain, decreased urine volume, enuresis, difficulty urinating, pelvic pain and dyspareunia.  Musculoskeletal: Negative.   Skin: Negative.   Neurological: Negative.   Hematological: Negative.   Psychiatric/Behavioral: Negative.        Objective:   Physical Exam  Constitutional: She appears well-developed and well-nourished. No distress.  HENT:  Head: Normocephalic and atraumatic.  Right Ear: External ear normal.  Left Ear: External ear normal.  Nose: Nose normal.  Mouth/Throat: Oropharynx is clear and moist. No oropharyngeal exudate.  Eyes: Conjunctivae and EOM are normal. Pupils are equal, round, and reactive to light. Right eye exhibits no discharge. Left eye exhibits no discharge. No scleral icterus.  Neck: Normal range of motion. Neck supple. No JVD present. No thyromegaly present.  Cardiovascular: Normal rate, regular rhythm, normal heart sounds and intact distal pulses.  Exam reveals no gallop and no friction rub.   No murmur heard. Pulmonary/Chest: Effort normal and breath sounds normal. No stridor. No respiratory distress. She has no wheezes. She has no rales. She exhibits no tenderness.  Abdominal: Soft. Normal appearance and bowel sounds are normal. She exhibits no distension, no abdominal bruit, no ascites and no mass. There is no hepatosplenomegaly. There is no tenderness. There is no rigidity, no  rebound and no guarding. No hernia.  Genitourinary: Rectum normal. No breast swelling, tenderness, discharge or bleeding. Cervix exhibits no motion tenderness, no discharge and no friability. Right adnexum displays no mass, no tenderness and no fullness. Left adnexum displays no mass, no tenderness and no fullness. No erythema around the vagina.  Musculoskeletal: Normal range of motion. She exhibits no edema and no tenderness.  Lymphadenopathy:    She has no cervical adenopathy.  Neurological: She is alert. She has normal reflexes. No cranial nerve deficit. She exhibits normal muscle tone. Coordination normal.  Skin: Skin is warm and dry. No rash noted. She is not diaphoretic. No erythema. No pallor.  Psychiatric: She has a normal mood and affect. Her behavior is normal. Judgment and thought content normal.          Assessment & Plan:  Well exam. Get fasting labs. Set up a DEXA and a colonoscopy.

## 2011-06-09 NOTE — Progress Notes (Signed)
Addended by: Gershon Crane A on: 06/09/2011 10:33 AM   Modules accepted: Orders

## 2011-06-09 NOTE — Progress Notes (Signed)
Addended by: Aniceto Boss A on: 06/09/2011 11:21 AM   Modules accepted: Orders

## 2011-06-14 ENCOUNTER — Telehealth: Payer: Self-pay | Admitting: Family Medicine

## 2011-06-14 NOTE — Telephone Encounter (Signed)
Message copied by Baldemar Friday on Tue Jun 14, 2011  5:55 PM ------      Message from: Gershon Crane A      Created: Fri Jun 10, 2011  4:59 PM       normal

## 2011-06-14 NOTE — Telephone Encounter (Signed)
Left voice message with normal results. 

## 2011-06-21 ENCOUNTER — Encounter: Payer: Self-pay | Admitting: Family Medicine

## 2011-06-21 ENCOUNTER — Other Ambulatory Visit: Payer: Self-pay | Admitting: Family Medicine

## 2011-06-21 MED ORDER — SIMVASTATIN 40 MG PO TABS: 40.0000 mg | ORAL_TABLET | Freq: Every day | ORAL | Status: DC

## 2011-06-21 NOTE — Telephone Encounter (Signed)
Pt would like results from bone density scan. I do not see that it has been reviewed yet?

## 2011-06-22 ENCOUNTER — Ambulatory Visit (AMBULATORY_SURGERY_CENTER): Payer: Medicare Other | Admitting: *Deleted

## 2011-06-22 VITALS — Ht 66.0 in | Wt 171.8 lb

## 2011-06-22 DIAGNOSIS — Z1211 Encounter for screening for malignant neoplasm of colon: Secondary | ICD-10-CM

## 2011-06-22 MED ORDER — MOVIPREP 100 G PO SOLR: ORAL | Status: DC

## 2011-06-23 ENCOUNTER — Telehealth: Payer: Self-pay | Admitting: *Deleted

## 2011-06-23 NOTE — Telephone Encounter (Signed)
Medical Release Form given to CMA for Dr. Rhea Belton.  Pt. Having colon 07/06/11 at [facility]  She said she had polyps and was instructed to have her next colon in 3-5 years and she had hers in 2007

## 2011-06-27 ENCOUNTER — Telehealth: Payer: Self-pay | Admitting: Family Medicine

## 2011-06-27 NOTE — Telephone Encounter (Signed)
Spoke with pt and gave results. 

## 2011-06-27 NOTE — Telephone Encounter (Signed)
Message copied by Baldemar Friday on Mon Jun 27, 2011  2:18 PM ------      Message from: Gershon Crane A      Created: Thu Jun 23, 2011  3:27 PM       Shows osteopenia. Use calcium and vitamin D daily

## 2011-07-06 ENCOUNTER — Ambulatory Visit (AMBULATORY_SURGERY_CENTER): Payer: Medicare Other | Admitting: Internal Medicine

## 2011-07-06 ENCOUNTER — Encounter: Payer: Self-pay | Admitting: Internal Medicine

## 2011-07-06 VITALS — BP 139/77 | HR 65 | Temp 97.4°F | Resp 20 | Ht 66.0 in | Wt 171.0 lb

## 2011-07-06 DIAGNOSIS — D126 Benign neoplasm of colon, unspecified: Secondary | ICD-10-CM

## 2011-07-06 DIAGNOSIS — D129 Benign neoplasm of anus and anal canal: Secondary | ICD-10-CM

## 2011-07-06 DIAGNOSIS — D128 Benign neoplasm of rectum: Secondary | ICD-10-CM

## 2011-07-06 DIAGNOSIS — Z1211 Encounter for screening for malignant neoplasm of colon: Secondary | ICD-10-CM

## 2011-07-06 DIAGNOSIS — Z86010 Personal history of colonic polyps: Secondary | ICD-10-CM

## 2011-07-06 MED ORDER — SODIUM CHLORIDE 0.9 % IV SOLN: 500.0000 mL | INTRAVENOUS | Status: DC

## 2011-07-06 NOTE — Patient Instructions (Addendum)
Please refer to your blue and neon green sheets for instructions regarding diet and activity for the rest of today.  Please HOLD ALL ASPIRIN/ASPIRIN CONTAINING PRODUCTS for 2 WEEkS (07/20/2011), otherwise, you may resume your medications as you would normally take them.   You will receive a letter in the mail in about two weeks regarding the results of the biopsies taken today.  Hemorrhoids Hemorrhoids are enlarged (dilated) veins around the rectum. There are 2 types of hemorrhoids, and the type of hemorrhoid is determined by its location. Internal hemorrhoids occur in the veins just inside the rectum.They are usually not painful, but they may bleed.However, they may poke through to the outside and become irritated and painful. External hemorrhoids involve the veins outside the anus and can be felt as a painful swelling or hard lump near the anus.They are often itchy and may crack and bleed. Sometimes clots will form in the veins. This makes them swollen and painful. These are called thrombosed hemorrhoids. CAUSES Causes of hemorrhoids include:  Pregnancy. This increases the pressure in the hemorrhoidal veins.   Constipation.   Straining to have a bowel movement.   Obesity.   Heavy lifting or other activity that caused you to strain.  TREATMENT Most of the time hemorrhoids improve in 1 to 2 weeks. However, if symptoms do not seem to be getting better or if you have a lot of rectal bleeding, your caregiver may perform a procedure to help make the hemorrhoids get smaller or remove them completely.Possible treatments include:  Rubber band ligation. A rubber band is placed at the base of the hemorrhoid to cut off the circulation.   Sclerotherapy. A chemical is injected to shrink the hemorrhoid.   Infrared light therapy. Tools are used to burn the hemorrhoid.   Hemorrhoidectomy. This is surgical removal of the hemorrhoid.  HOME CARE INSTRUCTIONS   Increase fiber in your diet. Ask your  caregiver about using fiber supplements.   Drink enough water and fluids to keep your urine clear or pale yellow.   Exercise regularly.   Go to the bathroom when you have the urge to have a bowel movement. Do not wait.   Avoid straining to have bowel movements.   Keep the anal area dry and clean.   Only take over-the-counter or prescription medicines for pain, discomfort, or fever as directed by your caregiver.  If your hemorrhoids are thrombosed:  Take warm sitz baths for 20 to 30 minutes, 3 to 4 times per day.   If the hemorrhoids are very tender and swollen, place ice packs on the area as tolerated. Using ice packs between sitz baths may be helpful. Fill a plastic bag with ice. Place a towel between the bag of ice and your skin.   Medicated creams and suppositories may be used or applied as directed.   Do not use a donut-shaped pillow or sit on the toilet for long periods. This increases blood pooling and pain.  SEEK MEDICAL CARE IF:   You have increasing pain and swelling that is not controlled with your medicine.   You have uncontrolled bleeding.   You have difficulty or you are unable to have a bowel movement.   You have pain or inflammation outside the area of the hemorrhoids.   You have chills or an oral temperature above 102 F (38.9 C).  MAKE SURE YOU:   Understand these instructions.   Will watch your condition.   Will get help right away if you are not  doing well or get worse.  Document Released: 08/05/2000 Document Revised: 04/20/2011 Document Reviewed: 12/11/2007 [facility] Patient Information 2012 [place], [place].  Diverticulosis Diverticulosis is a common condition that develops when small pouches (diverticula) form in the wall of the colon. The risk of diverticulosis increases with age. It happens more often in people who eat a low-fiber diet. Most individuals with diverticulosis have no symptoms. Those individuals with symptoms usually experience abdominal  pain, constipation, or loose stools (diarrhea). HOME CARE INSTRUCTIONS   Increase the amount of fiber in your diet as directed by your caregiver or dietician. This may reduce symptoms of diverticulosis.   Your caregiver may recommend taking a dietary fiber supplement.   Drink at least 6 to 8 glasses of water each day to prevent constipation.   Try not to strain when you have a bowel movement.   Your caregiver may recommend avoiding nuts and seeds to prevent complications, although this is still an uncertain benefit.   Only take over-the-counter or prescription medicines for pain, discomfort, or fever as directed by your caregiver.  FOODS WITH HIGH FIBER CONTENT INCLUDE:  Fruits. Apple, peach, pear, tangerine, raisins, prunes.   Vegetables. Brussels sprouts, asparagus, broccoli, cabbage, carrot, cauliflower, romaine lettuce, spinach, summer squash, tomato, winter squash, zucchini.   Starchy Vegetables. Baked beans, kidney beans, lima beans, split peas, lentils, potatoes (with skin).   Grains. Whole wheat bread, brown rice, bran flake cereal, plain oatmeal, white rice, shredded wheat, bran muffins.  SEEK IMMEDIATE MEDICAL CARE IF:   You develop increasing pain or severe bloating.   You have an oral temperature above 102 F (38.9 C), not controlled by medicine.   You develop vomiting or bowel movements that are bloody or black.  Document Released: 05/05/2004 Document Revised: 04/20/2011 Document Reviewed: 01/06/2010 [facility] Patient Information 2012 [place], [place].  Colon Polyps A polyp is extra tissue that grows inside your body. Colon polyps grow in the large intestine. The large intestine, also called the colon, is part of your digestive system. It is a long, hollow tube at the end of your digestive tract where your body makes and stores stool. Most polyps are not dangerous. They are benign. This means they are not cancerous. But over time, some types of polyps can turn into  cancer. Polyps that are smaller than a pea are usually not harmful. But larger polyps could someday become or may already be cancerous. To be safe, doctors remove all polyps and test them.  WHO GETS POLYPS? Anyone can get polyps, but certain people are more likely than others. You may have a greater chance of getting polyps if:  You are over 50.   You have had polyps before.   Someone in your family has had polyps.   Someone in your family has had cancer of the large intestine.   Find out if someone in your family has had polyps. You may also be more likely to get polyps if you:   Eat a lot of fatty foods.   Smoke.   Drink alcohol.   Do not exercise.   Eat too much.  SYMPTOMS  Most small polyps do not cause symptoms. People often do not know they have one until their caregiver finds it during a regular checkup or while testing them for something else. Some people do have symptoms like these:  Bleeding from the anus. You might notice blood on your underwear or on toilet paper after you have had a bowel movement.   Constipation or diarrhea  that lasts more than a week.   Blood in the stool. Blood can make stool look black or it can show up as red streaks in the stool.  If you have any of these symptoms, see your caregiver. HOW DOES THE DOCTOR TEST FOR POLYPS? The doctor can use four tests to check for polyps:  Digital rectal exam. The caregiver wears gloves and checks your rectum (the last part of the large intestine) to see if it feels normal. This test would find polyps only in the rectum. Your caregiver may need to do one of the other tests listed below to find polyps higher up in the intestine.   Barium enema. The caregiver puts a liquid called barium into your rectum before taking x-rays of your large intestine. Barium makes your intestine look white in the pictures. Polyps are dark, so they are easy to see.   Sigmoidoscopy. With this test, the caregiver can see inside your  large intestine. A thin flexible tube is placed into your rectum. The device is called a sigmoidoscope, which has a light and a tiny video camera in it. The caregiver uses the sigmoidoscope to look at the last third of your large intestine.   Colonoscopy. This test is like sigmoidoscopy, but the caregiver looks at all of the large intestine. It usually requires sedation. This is the most common method for finding and removing polyps.  TREATMENT   The caregiver will remove the polyp during sigmoidoscopy or colonoscopy. The polyp is then tested for cancer.   If you have had polyps, your caregiver may want you to get tested regularly in the future.  PREVENTION  There is not one sure way to prevent polyps. You might be able to lower your risk of getting them if you:  Eat more fruits and vegetables and less fatty food.   Do not smoke.   Avoid alcohol.   Exercise every day.   Lose weight if you are overweight.   Eating more calcium and folate can also lower your risk of getting polyps. Some foods that are rich in calcium are milk, cheese, and broccoli. Some foods that are rich in folate are chickpeas, kidney beans, and spinach.   Aspirin might help prevent polyps. Studies are under way.  Document Released: 05/04/2004 Document Revised: 04/20/2011 Document Reviewed: 10/10/2007 [facility] Patient Information 2012 [place], [place].

## 2011-07-07 ENCOUNTER — Telehealth: Payer: Self-pay | Admitting: *Deleted

## 2011-07-07 NOTE — Telephone Encounter (Signed)

## 2011-07-20 ENCOUNTER — Encounter: Payer: Self-pay | Admitting: Internal Medicine

## 2011-09-01 ENCOUNTER — Telehealth: Payer: Self-pay | Admitting: Family Medicine

## 2011-09-01 MED ORDER — FENOFIBRATE 160 MG PO TABS: 160.0000 mg | ORAL_TABLET | Freq: Every day | ORAL | Status: DC

## 2011-09-01 MED ORDER — TRIAMTERENE-HCTZ 37.5-25 MG PO TABS: 1.0000 | ORAL_TABLET | Freq: Every day | ORAL | Status: DC

## 2011-09-01 NOTE — Telephone Encounter (Signed)
Pharmacy request for a 90 day supply for pt and I did send e-scribe.

## 2011-09-06 ENCOUNTER — Other Ambulatory Visit: Payer: Self-pay | Admitting: Family Medicine

## 2011-09-06 DIAGNOSIS — Z1231 Encounter for screening mammogram for malignant neoplasm of breast: Secondary | ICD-10-CM

## 2011-09-22 ENCOUNTER — Ambulatory Visit
Admission: RE | Admit: 2011-09-22 | Discharge: 2011-09-22 | Disposition: A | Discharge status: Home / Self Care | Payer: Medicare Other | Source: Ambulatory Visit | Attending: Family Medicine | Admitting: Family Medicine

## 2011-09-22 DIAGNOSIS — Z1231 Encounter for screening mammogram for malignant neoplasm of breast: Secondary | ICD-10-CM

## 2011-12-09 ENCOUNTER — Ambulatory Visit (INDEPENDENT_AMBULATORY_CARE_PROVIDER_SITE_OTHER): Payer: Medicare Other | Admitting: Family Medicine

## 2011-12-09 ENCOUNTER — Encounter: Payer: Self-pay | Admitting: Family Medicine

## 2011-12-09 VITALS — BP 116/70 | HR 78 | Temp 98.5°F | Wt 155.0 lb

## 2011-12-09 DIAGNOSIS — I10 Essential (primary) hypertension: Secondary | ICD-10-CM | POA: Diagnosis not present

## 2011-12-09 DIAGNOSIS — E785 Hyperlipidemia, unspecified: Secondary | ICD-10-CM | POA: Diagnosis not present

## 2011-12-09 LAB — LIPID PANEL
Cholesterol: 144 mg/dL (ref 0–200)
HDL: 39.6 mg/dL (ref >39.00)
Total CHOL/HDL Ratio: 4
Triglycerides: 104 mg/dL (ref 0.0–149.0)

## 2011-12-09 LAB — HEPATIC FUNCTION PANEL
ALT: 22 U/L (ref 0–35)
Total Bilirubin: 0.3 mg/dL (ref 0.3–1.2)

## 2011-12-09 NOTE — Progress Notes (Signed)
  Subjective:    Patient ID: Rachael Mitchell, female    DOB: [birth year], 66 y.o.   MRN: [id number]  HPI Here to follow up on lipids and BP. She feels great and has no concerns. She has lost about 15 lbs. She is fasting.   Review of Systems  Constitutional: Negative.   Respiratory: Negative.   Cardiovascular: Negative.        Objective:   Physical Exam  Constitutional: She appears well-developed and well-nourished.  Cardiovascular: Normal rate, regular rhythm, normal heart sounds and intact distal pulses.   Pulmonary/Chest: Effort normal and breath sounds normal.          Assessment & Plan:  She is doing well. Get labs

## 2011-12-13 NOTE — Progress Notes (Signed)
Quick Note:  Pt informed, she requested a copy be mailed ______

## 2011-12-15 DIAGNOSIS — D485 Neoplasm of uncertain behavior of skin: Secondary | ICD-10-CM | POA: Diagnosis not present

## 2012-06-11 ENCOUNTER — Ambulatory Visit (INDEPENDENT_AMBULATORY_CARE_PROVIDER_SITE_OTHER): Payer: Medicare Other | Admitting: Family Medicine

## 2012-06-11 ENCOUNTER — Encounter: Payer: Self-pay | Admitting: Family Medicine

## 2012-06-11 VITALS — BP 118/70 | HR 80 | Temp 98.3°F | Ht 66.5 in | Wt 155.0 lb

## 2012-06-11 DIAGNOSIS — Z0000 Encounter for general adult medical examination without abnormal findings: Secondary | ICD-10-CM

## 2012-06-11 DIAGNOSIS — E785 Hyperlipidemia, unspecified: Secondary | ICD-10-CM

## 2012-06-11 DIAGNOSIS — I10 Essential (primary) hypertension: Secondary | ICD-10-CM

## 2012-06-11 DIAGNOSIS — Z23 Encounter for immunization: Secondary | ICD-10-CM

## 2012-06-11 DIAGNOSIS — E559 Vitamin D deficiency, unspecified: Secondary | ICD-10-CM | POA: Diagnosis not present

## 2012-06-11 LAB — CBC WITH DIFFERENTIAL/PLATELET
Basophils Absolute: 0 10*3/uL (ref 0.0–0.1)
Hemoglobin: 13.8 g/dL (ref 12.0–15.0)
Lymphocytes Relative: 41.8 % (ref 12.0–46.0)
Monocytes Relative: 7.9 % (ref 3.0–12.0)
Neutro Abs: 3 10*3/uL (ref 1.4–7.7)
Neutrophils Relative %: 48.4 % (ref 43.0–77.0)
Platelets: 387 10*3/uL (ref 150.0–400.0)
RDW: 12.2 % (ref 11.5–14.6)

## 2012-06-11 LAB — LIPID PANEL
Cholesterol: 163 mg/dL (ref 0–200)
LDL Cholesterol: 93 mg/dL (ref 0–99)
Triglycerides: 146 mg/dL (ref 0.0–149.0)

## 2012-06-11 LAB — POCT URINALYSIS DIPSTICK
Leukocytes, UA: NEGATIVE
Nitrite, UA: NEGATIVE
Protein, UA: NEGATIVE
Urobilinogen, UA: 0.2

## 2012-06-11 LAB — HEPATIC FUNCTION PANEL
ALT: 24 U/L (ref 0–35)
Total Bilirubin: 0.5 mg/dL (ref 0.3–1.2)
Total Protein: 7.1 g/dL (ref 6.0–8.3)

## 2012-06-11 LAB — TSH: TSH: 0.91 u[IU]/mL (ref 0.35–5.50)

## 2012-06-11 LAB — BASIC METABOLIC PANEL
BUN: 20 mg/dL (ref 6–23)
CO2: 28 mEq/L (ref 19–32)
Chloride: 102 mEq/L (ref 96–112)
Creatinine, Ser: 0.8 mg/dL (ref 0.4–1.2)

## 2012-06-11 MED ORDER — TRIAMTERENE-HCTZ 37.5-25 MG PO TABS: 1.0000 | ORAL_TABLET | Freq: Every day | ORAL | Status: DC

## 2012-06-11 MED ORDER — SUMATRIPTAN SUCCINATE 100 MG PO TABS: 100.0000 mg | ORAL_TABLET | Freq: Once | ORAL | Status: DC | PRN

## 2012-06-11 MED ORDER — SIMVASTATIN 40 MG PO TABS: 40.0000 mg | ORAL_TABLET | Freq: Every day | ORAL | Status: DC

## 2012-06-11 MED ORDER — FENOFIBRATE 160 MG PO TABS: 160.0000 mg | ORAL_TABLET | Freq: Every day | ORAL | Status: DC

## 2012-06-11 NOTE — Progress Notes (Signed)
  Subjective:    Patient ID: Rachael Mitchell, female    DOB: [birth year], 66 y.o.   MRN: [id number]  HPI 66 yr old female for a cpx. She feels fine except for her arthritis pains, involving the hips, back, and hands. She uses Tylenol only.    Review of Systems  Constitutional: Negative.   HENT: Negative.   Eyes: Negative.   Respiratory: Negative.   Cardiovascular: Negative.   Gastrointestinal: Negative.   Genitourinary: Negative for dysuria, urgency, frequency, hematuria, flank pain, decreased urine volume, enuresis, difficulty urinating, pelvic pain and dyspareunia.  Musculoskeletal: Negative.   Skin: Negative.   Neurological: Negative.   Hematological: Negative.   Psychiatric/Behavioral: Negative.        Objective:   Physical Exam  Constitutional: She is oriented to person, place, and time. She appears well-developed and well-nourished. No distress.  HENT:  Head: Normocephalic and atraumatic.  Right Ear: External ear normal.  Left Ear: External ear normal.  Nose: Nose normal.  Mouth/Throat: Oropharynx is clear and moist. No oropharyngeal exudate.  Eyes: Conjunctivae normal and EOM are normal. Pupils are equal, round, and reactive to light. No scleral icterus.  Neck: Normal range of motion. Neck supple. No JVD present. No thyromegaly present.  Cardiovascular: Normal rate, regular rhythm, normal heart sounds and intact distal pulses.  Exam reveals no gallop and no friction rub.   No murmur heard. Pulmonary/Chest: Effort normal and breath sounds normal. No respiratory distress. She has no wheezes. She has no rales. She exhibits no tenderness.  Abdominal: Soft. Bowel sounds are normal. She exhibits no distension and no mass. There is no tenderness. There is no rebound and no guarding.  Genitourinary: No breast swelling, tenderness, discharge or bleeding.  Musculoskeletal: Normal range of motion. She exhibits no edema and no tenderness.  Lymphadenopathy:    She has no cervical  adenopathy.  Neurological: She is alert and oriented to person, place, and time. She has normal reflexes. No cranial nerve deficit. She exhibits normal muscle tone. Coordination normal.  Skin: Skin is warm and dry. No rash noted. No erythema.  Psychiatric: She has a normal mood and affect. Her behavior is normal. Judgment and thought content normal.          Assessment & Plan:  Well exam. Get fasting labs today.

## 2012-06-11 NOTE — Addendum Note (Signed)
Addended by: Aniceto Boss A on: 06/11/2012 11:57 AM   Modules accepted: Orders

## 2012-06-12 LAB — VITAMIN D 25 HYDROXY (VIT D DEFICIENCY, FRACTURES): Vit D, 25-Hydroxy: 48 ng/mL (ref 30–89)

## 2012-06-12 NOTE — Progress Notes (Signed)
Quick Note:  I spoke with pt ______ 

## 2012-06-25 ENCOUNTER — Ambulatory Visit (INDEPENDENT_AMBULATORY_CARE_PROVIDER_SITE_OTHER): Payer: Medicare Other | Admitting: Family Medicine

## 2012-06-25 ENCOUNTER — Telehealth: Payer: Self-pay | Admitting: Family Medicine

## 2012-06-25 DIAGNOSIS — Z0000 Encounter for general adult medical examination without abnormal findings: Secondary | ICD-10-CM | POA: Diagnosis not present

## 2012-06-25 DIAGNOSIS — Z2911 Encounter for prophylactic immunotherapy for respiratory syncytial virus (RSV): Secondary | ICD-10-CM | POA: Diagnosis not present

## 2012-06-25 NOTE — Telephone Encounter (Signed)
Pt has signed up for my chart and she cannot see the labs that were done in October 2013. She can review results up until that time but not the latest ones. Please look into this and then give pt a call.

## 2012-06-25 NOTE — Telephone Encounter (Signed)
I released these results, so tell her they are now available in My Chart

## 2012-06-25 NOTE — Telephone Encounter (Signed)
I spoke with pt  

## 2012-08-07 DIAGNOSIS — H269 Unspecified cataract: Secondary | ICD-10-CM | POA: Diagnosis not present

## 2012-08-28 ENCOUNTER — Other Ambulatory Visit: Payer: Self-pay | Admitting: Family Medicine

## 2012-08-28 DIAGNOSIS — Z1231 Encounter for screening mammogram for malignant neoplasm of breast: Secondary | ICD-10-CM

## 2012-09-24 ENCOUNTER — Ambulatory Visit
Admission: RE | Admit: 2012-09-24 | Discharge: 2012-09-24 | Disposition: A | Discharge status: Home / Self Care | Payer: Medicare Other | Source: Ambulatory Visit | Attending: Family Medicine | Admitting: Family Medicine

## 2012-09-24 DIAGNOSIS — Z1231 Encounter for screening mammogram for malignant neoplasm of breast: Secondary | ICD-10-CM | POA: Diagnosis not present

## 2012-12-10 ENCOUNTER — Encounter: Payer: Self-pay | Admitting: Family Medicine

## 2012-12-10 ENCOUNTER — Ambulatory Visit (INDEPENDENT_AMBULATORY_CARE_PROVIDER_SITE_OTHER): Payer: Medicare Other | Admitting: Family Medicine

## 2012-12-10 VITALS — BP 128/78 | HR 95 | Temp 97.9°F | Wt 162.0 lb

## 2012-12-10 DIAGNOSIS — M1990 Unspecified osteoarthritis, unspecified site: Secondary | ICD-10-CM | POA: Diagnosis not present

## 2012-12-10 DIAGNOSIS — I10 Essential (primary) hypertension: Secondary | ICD-10-CM

## 2012-12-10 DIAGNOSIS — E785 Hyperlipidemia, unspecified: Secondary | ICD-10-CM

## 2012-12-10 LAB — HEPATIC FUNCTION PANEL
ALT: 29 U/L (ref 0–35)
AST: 25 U/L (ref 0–37)
Albumin: 4.3 g/dL (ref 3.5–5.2)
Alkaline Phosphatase: 46 U/L (ref 39–117)
Total Protein: 7.1 g/dL (ref 6.0–8.3)

## 2012-12-10 LAB — LIPID PANEL
Cholesterol: 157 mg/dL (ref 0–200)
HDL: 36.5 mg/dL — ABNORMAL LOW (ref >39.00)
Triglycerides: 99 mg/dL (ref 0.0–149.0)

## 2012-12-10 NOTE — Progress Notes (Signed)
  Subjective:    Patient ID: Rachael Mitchell, female    DOB: 14-Sep-1945, 67 y.o.   MRN: [id number]  HPI Here to follow up. She is doing well, her BP is stable. Aleve helps the pain in her hands and knees and hips.    Review of Systems  Constitutional: Negative.   Respiratory: Negative.   Cardiovascular: Negative.   Musculoskeletal: Positive for arthralgias.       Objective:   Physical Exam  Constitutional: She appears well-developed and well-nourished.  Cardiovascular: Normal rate, regular rhythm, normal heart sounds and intact distal pulses.   Pulmonary/Chest: Effort normal and breath sounds normal.          Assessment & Plan:  Get fasting labs.

## 2012-12-11 NOTE — Progress Notes (Signed)
Quick Note:  I released results in my chart. ______ 

## 2012-12-19 DIAGNOSIS — Z85828 Personal history of other malignant neoplasm of skin: Secondary | ICD-10-CM | POA: Diagnosis not present

## 2012-12-19 DIAGNOSIS — B372 Candidiasis of skin and nail: Secondary | ICD-10-CM | POA: Diagnosis not present

## 2013-06-12 ENCOUNTER — Encounter: Payer: Medicare Other | Admitting: Family Medicine

## 2013-06-14 ENCOUNTER — Ambulatory Visit (INDEPENDENT_AMBULATORY_CARE_PROVIDER_SITE_OTHER): Payer: Medicare Other | Admitting: Family Medicine

## 2013-06-14 ENCOUNTER — Encounter: Payer: Self-pay | Admitting: Family Medicine

## 2013-06-14 VITALS — BP 128/78 | HR 84 | Temp 98.4°F | Ht 66.0 in | Wt 163.0 lb

## 2013-06-14 DIAGNOSIS — Z23 Encounter for immunization: Secondary | ICD-10-CM | POA: Diagnosis not present

## 2013-06-14 DIAGNOSIS — I10 Essential (primary) hypertension: Secondary | ICD-10-CM

## 2013-06-14 DIAGNOSIS — E559 Vitamin D deficiency, unspecified: Secondary | ICD-10-CM | POA: Diagnosis not present

## 2013-06-14 DIAGNOSIS — R1011 Right upper quadrant pain: Secondary | ICD-10-CM | POA: Diagnosis not present

## 2013-06-14 DIAGNOSIS — M8580 Other specified disorders of bone density and structure, unspecified site: Secondary | ICD-10-CM

## 2013-06-14 DIAGNOSIS — M899 Disorder of bone, unspecified: Secondary | ICD-10-CM

## 2013-06-14 LAB — LIPID PANEL
Cholesterol: 146 mg/dL (ref 0–200)
HDL: 41.2 mg/dL (ref >39.00)
LDL Cholesterol: 79 mg/dL (ref 0–99)
Total CHOL/HDL Ratio: 4
Triglycerides: 127 mg/dL (ref 0.0–149.0)
VLDL: 25.4 mg/dL (ref 0.0–40.0)

## 2013-06-14 LAB — POCT URINALYSIS DIPSTICK
Bilirubin, UA: NEGATIVE
Blood, UA: NEGATIVE
Glucose, UA: NEGATIVE
Ketones, UA: NEGATIVE
Nitrite, UA: NEGATIVE
Spec Grav, UA: 1.015
pH, UA: 8.5

## 2013-06-14 LAB — CBC WITH DIFFERENTIAL/PLATELET
Basophils Relative: 0.7 % (ref 0.0–3.0)
Eosinophils Absolute: 0.2 10*3/uL (ref 0.0–0.7)
Eosinophils Relative: 2.2 % (ref 0.0–5.0)
HCT: 38.4 % (ref 36.0–46.0)
Hemoglobin: 13.2 g/dL (ref 12.0–15.0)
Lymphocytes Relative: 34.7 % (ref 12.0–46.0)
Lymphs Abs: 2.7 10*3/uL (ref 0.7–4.0)
MCHC: 34.3 g/dL (ref 30.0–36.0)
MCV: 89.3 fl (ref 78.0–100.0)
Monocytes Absolute: 0.5 10*3/uL (ref 0.1–1.0)
Monocytes Relative: 6.6 % (ref 3.0–12.0)
Neutro Abs: 4.4 10*3/uL (ref 1.4–7.7)
Neutrophils Relative %: 55.8 % (ref 43.0–77.0)
RBC: 4.3 Mil/uL (ref 3.87–5.11)
WBC: 7.8 10*3/uL (ref 4.5–10.5)

## 2013-06-14 LAB — BASIC METABOLIC PANEL
BUN: 19 mg/dL (ref 6–23)
Calcium: 10.2 mg/dL (ref 8.4–10.5)
GFR: 78.03 mL/min (ref >60.00)
Potassium: 5.2 mEq/L — ABNORMAL HIGH (ref 3.5–5.1)
Sodium: 142 mEq/L (ref 135–145)

## 2013-06-14 LAB — HEPATIC FUNCTION PANEL
AST: 33 U/L (ref 0–37)
Alkaline Phosphatase: 43 U/L (ref 39–117)
Bilirubin, Direct: 0.1 mg/dL (ref 0.0–0.3)
Total Bilirubin: 0.5 mg/dL (ref 0.3–1.2)

## 2013-06-14 MED ORDER — TRIAMTERENE-HCTZ 37.5-25 MG PO TABS: 1.0000 | ORAL_TABLET | Freq: Every day | ORAL | Status: DC

## 2013-06-14 MED ORDER — SUMATRIPTAN SUCCINATE 100 MG PO TABS: 100.0000 mg | ORAL_TABLET | Freq: Once | ORAL | Status: DC | PRN

## 2013-06-14 MED ORDER — FENOFIBRATE 160 MG PO TABS: 160.0000 mg | ORAL_TABLET | Freq: Every day | ORAL | Status: DC

## 2013-06-14 MED ORDER — SIMVASTATIN 40 MG PO TABS: 40.0000 mg | ORAL_TABLET | Freq: Every day | ORAL | Status: DC

## 2013-06-14 NOTE — Progress Notes (Signed)
  Subjective:    Patient ID: Rachael Mitchell, female    DOB: 1945-12-11, 67 y.o.   MRN: [id number]  HPI 67 yr old female for a cpx. She feels well except for some recent intermittent pains in the RUQ immediately after a heavy meal. The pains are sharp and last about 20-30 minutes. No fever or nausea. No change in BMs or urinations.    Review of Systems  Constitutional: Negative.   HENT: Negative.   Eyes: Negative.   Respiratory: Negative.   Cardiovascular: Negative.   Gastrointestinal: Negative.   Genitourinary: Negative for dysuria, urgency, frequency, hematuria, flank pain, decreased urine volume, enuresis, difficulty urinating, pelvic pain and dyspareunia.  Musculoskeletal: Negative.   Skin: Negative.   Neurological: Negative.   Psychiatric/Behavioral: Negative.        Objective:   Physical Exam  Constitutional: She appears well-developed and well-nourished. No distress.  HENT:  Head: Normocephalic and atraumatic.  Right Ear: External ear normal.  Left Ear: External ear normal.  Nose: Nose normal.  Mouth/Throat: Oropharynx is clear and moist. No oropharyngeal exudate.  Eyes: Conjunctivae and EOM are normal. Pupils are equal, round, and reactive to light. Right eye exhibits no discharge. Left eye exhibits no discharge. No scleral icterus.  Neck: Normal range of motion. Neck supple. No JVD present. No thyromegaly present.  Cardiovascular: Normal rate, regular rhythm, normal heart sounds and intact distal pulses.  Exam reveals no gallop and no friction rub.   No murmur heard. EKG normal   Pulmonary/Chest: Effort normal and breath sounds normal. No stridor. No respiratory distress. She has no wheezes. She has no rales. She exhibits no tenderness.  Abdominal: Soft. Normal appearance and bowel sounds are normal. She exhibits no distension, no abdominal bruit, no ascites and no mass. There is no hepatosplenomegaly. There is no tenderness. There is no rigidity, no rebound and no guarding.  No hernia.  Genitourinary: Rectum normal. No breast swelling, tenderness, discharge or bleeding. Cervix exhibits no motion tenderness, no discharge and no friability. Right adnexum displays no mass, no tenderness and no fullness. Left adnexum displays no mass, no tenderness and no fullness. No erythema, tenderness or bleeding around the vagina.  Musculoskeletal: Normal range of motion. She exhibits no edema and no tenderness.  Lymphadenopathy:    She has no cervical adenopathy.  Neurological: She is alert. She has normal reflexes. No cranial nerve deficit. She exhibits normal muscle tone. Coordination normal.  Skin: Skin is warm and dry. No rash noted. She is not diaphoretic. No erythema. No pallor.  Psychiatric: She has a normal mood and affect. Her behavior is normal. Judgment and thought content normal.          Assessment & Plan:  Well exam. Get fasting labs. Set up an [place] to look at her gall bladder.

## 2013-06-18 NOTE — Progress Notes (Signed)
Quick Note:  I released results in my chart. ______ 

## 2013-06-21 ENCOUNTER — Ambulatory Visit
Admission: RE | Admit: 2013-06-21 | Discharge: 2013-06-21 | Disposition: A | Discharge status: Home / Self Care | Payer: Medicare Other | Source: Ambulatory Visit | Attending: Family Medicine | Admitting: Family Medicine

## 2013-06-21 DIAGNOSIS — R1011 Right upper quadrant pain: Secondary | ICD-10-CM | POA: Diagnosis not present

## 2013-06-21 NOTE — Progress Notes (Signed)
Quick Note:  I spoke with pt and released results in my chart ______ 

## 2013-07-18 ENCOUNTER — Other Ambulatory Visit: Payer: Self-pay | Admitting: Family Medicine

## 2013-08-05 ENCOUNTER — Ambulatory Visit (INDEPENDENT_AMBULATORY_CARE_PROVIDER_SITE_OTHER)
Admission: RE | Admit: 2013-08-05 | Discharge: 2013-08-05 | Disposition: A | Discharge status: Home / Self Care | Payer: Medicare Other | Source: Ambulatory Visit | Attending: Family Medicine | Admitting: Family Medicine

## 2013-08-05 DIAGNOSIS — M899 Disorder of bone, unspecified: Secondary | ICD-10-CM

## 2013-08-05 DIAGNOSIS — M8580 Other specified disorders of bone density and structure, unspecified site: Secondary | ICD-10-CM

## 2013-08-12 ENCOUNTER — Encounter: Payer: Self-pay | Admitting: Family Medicine

## 2013-08-13 DIAGNOSIS — H524 Presbyopia: Secondary | ICD-10-CM | POA: Diagnosis not present

## 2013-08-13 DIAGNOSIS — H269 Unspecified cataract: Secondary | ICD-10-CM | POA: Diagnosis not present

## 2013-08-13 DIAGNOSIS — H5200 Hypermetropia, unspecified eye: Secondary | ICD-10-CM | POA: Diagnosis not present

## 2013-08-13 DIAGNOSIS — H52229 Regular astigmatism, unspecified eye: Secondary | ICD-10-CM | POA: Diagnosis not present

## 2013-09-03 ENCOUNTER — Other Ambulatory Visit: Payer: Self-pay | Admitting: Family Medicine

## 2013-09-12 ENCOUNTER — Other Ambulatory Visit: Payer: Self-pay

## 2013-09-12 DIAGNOSIS — Z1231 Encounter for screening mammogram for malignant neoplasm of breast: Secondary | ICD-10-CM

## 2013-10-01 ENCOUNTER — Ambulatory Visit
Admission: RE | Admit: 2013-10-01 | Discharge: 2013-10-01 | Disposition: A | Discharge status: Home / Self Care | Payer: Medicare Other | Source: Ambulatory Visit

## 2013-10-01 DIAGNOSIS — Z1231 Encounter for screening mammogram for malignant neoplasm of breast: Secondary | ICD-10-CM

## 2013-10-29 DIAGNOSIS — H2510 Age-related nuclear cataract, unspecified eye: Secondary | ICD-10-CM | POA: Diagnosis not present

## 2013-10-29 DIAGNOSIS — H18419 Arcus senilis, unspecified eye: Secondary | ICD-10-CM | POA: Diagnosis not present

## 2013-10-29 DIAGNOSIS — H25019 Cortical age-related cataract, unspecified eye: Secondary | ICD-10-CM | POA: Diagnosis not present

## 2013-10-29 DIAGNOSIS — H02839 Dermatochalasis of unspecified eye, unspecified eyelid: Secondary | ICD-10-CM | POA: Diagnosis not present

## 2013-10-29 DIAGNOSIS — H25049 Posterior subcapsular polar age-related cataract, unspecified eye: Secondary | ICD-10-CM | POA: Diagnosis not present

## 2013-12-11 NOTE — Progress Notes (Signed)
   Subjective:    Patient ID: Rachael Mitchell, female    DOB: [birth year], 68 y.o.   MRN: [id number]  HPI Here to follow up. She feels great and her BP is stable.    Review of Systems  Constitutional: Negative.   Respiratory: Negative.   Cardiovascular: Negative.        Objective:   Physical Exam  Constitutional: She appears well-developed and well-nourished.  Cardiovascular: Normal rate, regular rhythm, normal heart sounds and intact distal pulses.   Pulmonary/Chest: Effort normal and breath sounds normal.    Assessment & Plan:  Get fasting labs

## 2013-12-12 ENCOUNTER — Encounter: Payer: Self-pay | Admitting: Family Medicine

## 2013-12-12 ENCOUNTER — Ambulatory Visit (INDEPENDENT_AMBULATORY_CARE_PROVIDER_SITE_OTHER): Payer: Medicare Other | Admitting: Family Medicine

## 2013-12-12 VITALS — BP 126/70 | HR 78 | Temp 98.0°F | Ht 66.0 in | Wt 142.0 lb

## 2013-12-12 DIAGNOSIS — E785 Hyperlipidemia, unspecified: Secondary | ICD-10-CM

## 2013-12-12 DIAGNOSIS — I10 Essential (primary) hypertension: Secondary | ICD-10-CM | POA: Diagnosis not present

## 2013-12-12 LAB — HEPATIC FUNCTION PANEL
ALT: 21 U/L (ref 0–35)
AST: 19 U/L (ref 0–37)
Albumin: 4.2 g/dL (ref 3.5–5.2)
Alkaline Phosphatase: 50 U/L (ref 39–117)
BILIRUBIN TOTAL: 0.6 mg/dL (ref 0.3–1.2)
Bilirubin, Direct: 0.1 mg/dL (ref 0.0–0.3)
Total Protein: 7.1 g/dL (ref 6.0–8.3)

## 2013-12-12 LAB — LIPID PANEL
CHOL/HDL RATIO: 3
Cholesterol: 131 mg/dL (ref 0–200)
HDL: 50.5 mg/dL (ref >39.00)
LDL Cholesterol: 69 mg/dL (ref 0–99)
Triglycerides: 58 mg/dL (ref 0.0–149.0)
VLDL: 11.6 mg/dL (ref 0.0–40.0)

## 2013-12-12 NOTE — Progress Notes (Signed)
Pre visit review using our clinic review tool, if applicable. No additional management support is needed unless otherwise documented below in the visit note. 

## 2013-12-13 ENCOUNTER — Ambulatory Visit: Payer: Medicare Other | Admitting: Family Medicine

## 2013-12-13 ENCOUNTER — Telehealth: Payer: Self-pay | Admitting: Family Medicine

## 2013-12-13 NOTE — Telephone Encounter (Signed)
Relevant patient education assigned to patient using Emmi. ° °

## 2013-12-23 DIAGNOSIS — H2510 Age-related nuclear cataract, unspecified eye: Secondary | ICD-10-CM | POA: Diagnosis not present

## 2013-12-23 DIAGNOSIS — H269 Unspecified cataract: Secondary | ICD-10-CM | POA: Diagnosis not present

## 2013-12-24 DIAGNOSIS — H2510 Age-related nuclear cataract, unspecified eye: Secondary | ICD-10-CM | POA: Diagnosis not present

## 2013-12-30 DIAGNOSIS — D235 Other benign neoplasm of skin of trunk: Secondary | ICD-10-CM | POA: Diagnosis not present

## 2013-12-30 DIAGNOSIS — D1801 Hemangioma of skin and subcutaneous tissue: Secondary | ICD-10-CM | POA: Diagnosis not present

## 2014-01-20 DIAGNOSIS — H2510 Age-related nuclear cataract, unspecified eye: Secondary | ICD-10-CM | POA: Diagnosis not present

## 2014-01-20 DIAGNOSIS — H269 Unspecified cataract: Secondary | ICD-10-CM | POA: Diagnosis not present

## 2014-05-27 ENCOUNTER — Encounter: Payer: Self-pay | Admitting: Internal Medicine

## 2014-06-06 ENCOUNTER — Other Ambulatory Visit: Payer: Self-pay

## 2014-06-16 ENCOUNTER — Ambulatory Visit (INDEPENDENT_AMBULATORY_CARE_PROVIDER_SITE_OTHER): Payer: Medicare Other | Admitting: Family Medicine

## 2014-06-16 ENCOUNTER — Encounter: Payer: Self-pay | Admitting: Family Medicine

## 2014-06-16 VITALS — BP 125/68 | HR 72 | Temp 98.7°F | Ht 66.0 in | Wt 144.0 lb

## 2014-06-16 DIAGNOSIS — M899 Disorder of bone, unspecified: Secondary | ICD-10-CM

## 2014-06-16 DIAGNOSIS — Z23 Encounter for immunization: Secondary | ICD-10-CM | POA: Diagnosis not present

## 2014-06-16 DIAGNOSIS — M150 Primary generalized (osteo)arthritis: Secondary | ICD-10-CM

## 2014-06-16 DIAGNOSIS — I10 Essential (primary) hypertension: Secondary | ICD-10-CM | POA: Diagnosis not present

## 2014-06-16 DIAGNOSIS — M159 Polyosteoarthritis, unspecified: Secondary | ICD-10-CM

## 2014-06-16 DIAGNOSIS — E559 Vitamin D deficiency, unspecified: Secondary | ICD-10-CM

## 2014-06-16 DIAGNOSIS — G43809 Other migraine, not intractable, without status migrainosus: Secondary | ICD-10-CM

## 2014-06-16 DIAGNOSIS — M949 Disorder of cartilage, unspecified: Secondary | ICD-10-CM

## 2014-06-16 DIAGNOSIS — E785 Hyperlipidemia, unspecified: Secondary | ICD-10-CM | POA: Diagnosis not present

## 2014-06-16 LAB — POCT URINALYSIS DIPSTICK
BILIRUBIN UA: NEGATIVE
Glucose, UA: NEGATIVE
KETONES UA: NEGATIVE
Leukocytes, UA: NEGATIVE
NITRITE UA: NEGATIVE
Protein, UA: NEGATIVE
RBC UA: NEGATIVE
SPEC GRAV UA: 1.015
Urobilinogen, UA: 0.2
pH, UA: 8.5

## 2014-06-16 LAB — HEPATIC FUNCTION PANEL
ALBUMIN: 3.8 g/dL (ref 3.5–5.2)
ALK PHOS: 55 U/L (ref 39–117)
ALT: 21 U/L (ref 0–35)
AST: 24 U/L (ref 0–37)
BILIRUBIN DIRECT: 0.1 mg/dL (ref 0.0–0.3)
Total Bilirubin: 0.4 mg/dL (ref 0.2–1.2)
Total Protein: 7.2 g/dL (ref 6.0–8.3)

## 2014-06-16 LAB — LIPID PANEL
Cholesterol: 157 mg/dL (ref 0–200)
HDL: 50.9 mg/dL (ref >39.00)
LDL CALC: 86 mg/dL (ref 0–99)
NONHDL: 106.1
Total CHOL/HDL Ratio: 3
Triglycerides: 100 mg/dL (ref 0.0–149.0)
VLDL: 20 mg/dL (ref 0.0–40.0)

## 2014-06-16 LAB — CBC WITH DIFFERENTIAL/PLATELET
BASOS PCT: 0.6 % (ref 0.0–3.0)
Basophils Absolute: 0 10*3/uL (ref 0.0–0.1)
Eosinophils Absolute: 0.1 10*3/uL (ref 0.0–0.7)
Eosinophils Relative: 1 % (ref 0.0–5.0)
HCT: 41.4 % (ref 36.0–46.0)
HEMOGLOBIN: 13.9 g/dL (ref 12.0–15.0)
Lymphocytes Relative: 34.8 % (ref 12.0–46.0)
Lymphs Abs: 2.6 10*3/uL (ref 0.7–4.0)
MCHC: 33.5 g/dL (ref 30.0–36.0)
MCV: 90.3 fl (ref 78.0–100.0)
MONO ABS: 0.6 10*3/uL (ref 0.1–1.0)
Monocytes Relative: 7.6 % (ref 3.0–12.0)
NEUTROS PCT: 56 % (ref 43.0–77.0)
Neutro Abs: 4.1 10*3/uL (ref 1.4–7.7)
Platelets: 410 10*3/uL — ABNORMAL HIGH (ref 150.0–400.0)
RBC: 4.58 Mil/uL (ref 3.87–5.11)
RDW: 12.5 % (ref 11.5–15.5)
WBC: 7.4 10*3/uL (ref 4.0–10.5)

## 2014-06-16 LAB — BASIC METABOLIC PANEL
BUN: 15 mg/dL (ref 6–23)
CO2: 30 mEq/L (ref 19–32)
Calcium: 9.9 mg/dL (ref 8.4–10.5)
Chloride: 99 mEq/L (ref 96–112)
Creatinine, Ser: 0.7 mg/dL (ref 0.4–1.2)
GFR: 85.32 mL/min (ref >60.00)
Glucose, Bld: 92 mg/dL (ref 70–99)
Potassium: 4.8 mEq/L (ref 3.5–5.1)
SODIUM: 136 meq/L (ref 135–145)

## 2014-06-16 LAB — TSH: TSH: 0.99 u[IU]/mL (ref 0.35–4.50)

## 2014-06-16 LAB — VITAMIN D 25 HYDROXY (VIT D DEFICIENCY, FRACTURES): VITD: 50.99 ng/mL (ref 30.00–100.00)

## 2014-06-16 MED ORDER — FENOFIBRATE 160 MG PO TABS: 160.0000 mg | ORAL_TABLET | Freq: Every day | ORAL | Status: DC

## 2014-06-16 MED ORDER — TRIAMTERENE-HCTZ 37.5-25 MG PO TABS: 1.0000 | ORAL_TABLET | Freq: Every day | ORAL | Status: DC

## 2014-06-16 MED ORDER — SUMATRIPTAN SUCCINATE 100 MG PO TABS: 100.0000 mg | ORAL_TABLET | Freq: Once | ORAL | Status: DC | PRN

## 2014-06-16 MED ORDER — SIMVASTATIN 40 MG PO TABS: 40.0000 mg | ORAL_TABLET | Freq: Every day | ORAL | Status: DC

## 2014-06-16 NOTE — Progress Notes (Signed)
   Subjective:    Patient ID: Rachael Mitchell, female    DOB: 06/24/1946, 68 y.o.   MRN: [id number]  HPI 68 yr old female for a cpx. She feels well. She is setting up another colonoscopy for next month.    Review of Systems  Constitutional: Negative.   HENT: Negative.   Eyes: Negative.   Respiratory: Negative.   Cardiovascular: Negative.   Gastrointestinal: Negative.   Genitourinary: Negative for dysuria, urgency, frequency, hematuria, flank pain, decreased urine volume, enuresis, difficulty urinating, pelvic pain and dyspareunia.  Musculoskeletal: Negative.   Skin: Negative.   Neurological: Negative.   Psychiatric/Behavioral: Negative.        Objective:   Physical Exam  Constitutional: She is oriented to person, place, and time. She appears well-developed and well-nourished. No distress.  HENT:  Head: Normocephalic and atraumatic.  Right Ear: External ear normal.  Left Ear: External ear normal.  Nose: Nose normal.  Mouth/Throat: Oropharynx is clear and moist. No oropharyngeal exudate.  Eyes: Conjunctivae and EOM are normal. Pupils are equal, round, and reactive to light. No scleral icterus.  Neck: Normal range of motion. Neck supple. No JVD present. No thyromegaly present.  Cardiovascular: Normal rate, regular rhythm, normal heart sounds and intact distal pulses.  Exam reveals no gallop and no friction rub.   No murmur heard. EKG normal   Pulmonary/Chest: Effort normal and breath sounds normal. No respiratory distress. She has no wheezes. She has no rales. She exhibits no tenderness.  Abdominal: Soft. Bowel sounds are normal. She exhibits no distension and no mass. There is no tenderness. There is no rebound and no guarding.  Genitourinary: No breast swelling, tenderness, discharge or bleeding.  Musculoskeletal: Normal range of motion. She exhibits no edema and no tenderness.  Lymphadenopathy:    She has no cervical adenopathy.  Neurological: She is alert and oriented to  person, place, and time. She has normal reflexes. No cranial nerve deficit. She exhibits normal muscle tone. Coordination normal.  Skin: Skin is warm and dry. No rash noted. No erythema.  Psychiatric: She has a normal mood and affect. Her behavior is normal. Judgment and thought content normal.          Assessment & Plan:  well exam. Get fasting labs

## 2014-06-16 NOTE — Progress Notes (Signed)
Pre visit review using our clinic review tool, if applicable. No additional management support is needed unless otherwise documented below in the visit note. 

## 2014-06-23 ENCOUNTER — Encounter: Payer: Self-pay | Admitting: Internal Medicine

## 2014-07-21 DIAGNOSIS — H43393 Other vitreous opacities, bilateral: Secondary | ICD-10-CM | POA: Diagnosis not present

## 2014-08-11 ENCOUNTER — Ambulatory Visit (AMBULATORY_SURGERY_CENTER): Payer: Medicare Other | Admitting: *Deleted

## 2014-08-11 VITALS — Ht 65.0 in | Wt 151.4 lb

## 2014-08-11 DIAGNOSIS — Z86010 Personal history of colonic polyps: Secondary | ICD-10-CM

## 2014-08-11 MED ORDER — MOVIPREP 100 G PO SOLR: 1.0000 | Freq: Once | ORAL | Status: DC

## 2014-08-25 ENCOUNTER — Ambulatory Visit (AMBULATORY_SURGERY_CENTER): Payer: Medicare Other | Admitting: Internal Medicine

## 2014-08-25 ENCOUNTER — Encounter: Payer: Self-pay | Admitting: Internal Medicine

## 2014-08-25 VITALS — BP 146/52 | HR 58 | Temp 97.4°F | Resp 16 | Ht 65.0 in | Wt 151.0 lb

## 2014-08-25 DIAGNOSIS — Z86010 Personal history of colonic polyps: Secondary | ICD-10-CM

## 2014-08-25 DIAGNOSIS — D122 Benign neoplasm of ascending colon: Secondary | ICD-10-CM | POA: Diagnosis not present

## 2014-08-25 DIAGNOSIS — D120 Benign neoplasm of cecum: Secondary | ICD-10-CM

## 2014-08-25 DIAGNOSIS — D123 Benign neoplasm of transverse colon: Secondary | ICD-10-CM

## 2014-08-25 DIAGNOSIS — I10 Essential (primary) hypertension: Secondary | ICD-10-CM | POA: Diagnosis not present

## 2014-08-25 MED ORDER — SODIUM CHLORIDE 0.9 % IV SOLN: 500.0000 mL | INTRAVENOUS | Status: DC

## 2014-08-25 NOTE — Patient Instructions (Signed)
AVOID NSAIDS (MOTRIN, IBUPROFEN, ALEVE ETC) UNTIL January 18,2016.  HANDOUTS GIVEN FOR POLYPS, DIVERTICULOSIS, AND HIGH FIBER.   YOU HAD AN ENDOSCOPIC PROCEDURE TODAY AT [facility]: Refer to the procedure report that was given to you for any specific questions about what was found during the examination.  If the procedure report does not answer your questions, please call your gastroenterologist to clarify.  If you requested that your care partner not be given the details of your procedure findings, then the procedure report has been included in a sealed envelope for you to review at your convenience later.  YOU SHOULD EXPECT: Some feelings of bloating in the abdomen. Passage of more gas than usual.  Walking can help get rid of the air that was put into your GI tract during the procedure and reduce the bloating. If you had a lower endoscopy (such as a colonoscopy or flexible sigmoidoscopy) you may notice spotting of blood in your stool or on the toilet paper. If you underwent a bowel prep for your procedure, then you may not have a normal bowel movement for a few days.  DIET: Your first meal following the procedure should be a light meal and then it is ok to progress to your normal diet.  A half-sandwich or bowl of soup is an example of a good first meal.  Heavy or fried foods are harder to digest and may make you feel nauseous or bloated.  Likewise meals heavy in dairy and vegetables can cause extra gas to form and this can also increase the bloating.  Drink plenty of fluids but you should avoid alcoholic beverages for 24 hours.  ACTIVITY: Your care partner should take you home directly after the procedure.  You should plan to take it easy, moving slowly for the rest of the day.  You can resume normal activity the day after the procedure however you should NOT DRIVE or use heavy machinery for 24 hours (because of the sedation medicines used during the test).    SYMPTOMS TO REPORT  IMMEDIATELY: A gastroenterologist can be reached at any hour.  During normal business hours, 8:30 AM to 5:00 PM Monday through Friday, call [phone].  After hours and on weekends, please call the GI answering service at [phone] who will take a message and have the physician on call contact you.   Following lower endoscopy (colonoscopy or flexible sigmoidoscopy):  Excessive amounts of blood in the stool  Significant tenderness or worsening of abdominal pains  Swelling of the abdomen that is new, acute  Fever of 100F or higher  FOLLOW UP: If any biopsies were taken you will be contacted by phone or by letter within the next 1-3 weeks.  Call your gastroenterologist if you have not heard about the biopsies in 3 weeks.  Our staff will call the home number listed on your records the next business day following your procedure to check on you and address any questions or concerns that you may have at that time regarding the information given to you following your procedure. This is a courtesy call and so if there is no answer at the home number and we have not heard from you through the emergency physician on call, we will assume that you have returned to your regular daily activities without incident.  SIGNATURES/CONFIDENTIALITY: You and/or your care partner have signed paperwork which will be entered into your electronic medical record.  These signatures attest to the fact that that the information  above on your After Visit Summary has been reviewed and is understood.  Full responsibility of the confidentiality of this discharge information lies with you and/or your care-partner. 

## 2014-08-25 NOTE — Op Note (Signed)
[place]  [address] [place], [id number]   COLONOSCOPY PROCEDURE REPORT  PATIENT: Rachael Mitchell, Rachael Mitchell  MR#: [id number] BIRTHDATE: 1945/09/21 , 60  yrs. old GENDER: female ENDOSCOPIST: Jerene Bears, MD PROCEDURE DATE:  08/25/2014 PROCEDURE:   Colonoscopy with cold biopsy polypectomy and Colonoscopy with snare polypectomy First Screening Colonoscopy - Avg.  risk and is 50 yrs.  old or older - No.  Prior Negative Screening - Now for repeat screening. N/A  History of Adenoma - Now for follow-up colonoscopy & has been > or = to 3 yrs.  Yes hx of adenoma.  Has been 3 or more years since last colonoscopy.  Polyps Removed Today? Yes. ASA CLASS:   Class II INDICATIONS:surveillance colonoscopy based on a history of adenomatous colonic polyp(s) and last colonoscopy completed 3 years ago. MEDICATIONS: Monitored anesthesia care and Propofol 250 mg IV  DESCRIPTION OF PROCEDURE:   After the risks benefits and alternatives of the procedure were thoroughly explained, informed consent was obtained.  The digital rectal exam revealed no rectal mass.   The LB [id number]  endoscope was introduced through the anus and advanced to the cecum, which was identified by both the appendix and ileocecal valve. No adverse events experienced. The quality of the prep was good, using MoviPrep  The instrument was then slowly withdrawn as the colon was fully examined.  COLON FINDINGS: Seven sessile polyps ranging between 3-57mm in size were found in the ascending colon (3), transverse colon (3), and at the cecum (1).  Polypectomies were performed with a cold snare (5) and with cold forceps (2).  The resection was complete, the polyp tissue was completely retrieved and sent to histology.   There was moderate diverticulosis noted in the descending colon and sigmoid colon.  Retroflexed views revealed internal hemorrhoids. The time to cecum=7 minutes 52 seconds.  Withdrawal time=21 minutes  07 seconds.  The scope was withdrawn and the procedure completed. COMPLICATIONS: There were no immediate complications.  ENDOSCOPIC IMPRESSION: 1.   Seven sessile polyps ranging between 3-31mm in size were found in the ascending colon, transverse colon, and at the cecum; polypectomies were performed with a cold snare and with cold forceps 2.   Moderate diverticulosis was noted in the descending colon and sigmoid colon  RECOMMENDATIONS: 1.  Avoid all NSAIDS for the next 2 weeks. 2.  Await pathology results 3.  High fiber diet 4.  Timing of repeat colonoscopy will be determined by pathology findings. 5.  You will receive a letter within 1-2 weeks with the results of your biopsy as well as final recommendations.  Please call my office if you have not received a letter after 3 weeks.  eSigned:  Jerene Bears, MD 08/25/2014 11:41 AM cc: The Patient and Laurey Morale, MD

## 2014-08-25 NOTE — Progress Notes (Signed)
Procedure ends, to recovery, report given and VSS. 

## 2014-08-25 NOTE — Progress Notes (Signed)
Called to room to assist during endoscopic procedure.  Patient ID and intended procedure confirmed with present staff. Received instructions for my participation in the procedure from the performing physician.  

## 2014-08-26 ENCOUNTER — Telehealth: Payer: Self-pay | Admitting: *Deleted

## 2014-08-26 NOTE — Telephone Encounter (Signed)
  Follow up Call-  Call back number 08/25/2014  Post procedure Call Back phone  # [phone]  Permission to leave phone message Yes     Patient questions:  Do you have a fever, pain , or abdominal swelling? No. Pain Score  0 *  Have you tolerated food without any problems? Yes.    Have you been able to return to your normal activities? Yes.    Do you have any questions about your discharge instructions: Diet   No. Medications  No. Follow up visit  No.  Do you have questions or concerns about your Care? No.  Actions: * If pain score is 4 or above: No action needed, pain <4.

## 2014-09-03 ENCOUNTER — Encounter: Payer: Self-pay | Admitting: Internal Medicine

## 2014-09-23 ENCOUNTER — Other Ambulatory Visit: Payer: Self-pay

## 2014-09-23 DIAGNOSIS — Z1231 Encounter for screening mammogram for malignant neoplasm of breast: Secondary | ICD-10-CM

## 2014-10-03 ENCOUNTER — Ambulatory Visit
Admission: RE | Admit: 2014-10-03 | Discharge: 2014-10-03 | Disposition: A | Discharge status: Home / Self Care | Payer: Medicare Other | Source: Ambulatory Visit

## 2014-10-03 DIAGNOSIS — Z1231 Encounter for screening mammogram for malignant neoplasm of breast: Secondary | ICD-10-CM | POA: Diagnosis not present

## 2014-10-08 ENCOUNTER — Other Ambulatory Visit: Payer: Self-pay | Admitting: Family Medicine

## 2014-10-08 ENCOUNTER — Other Ambulatory Visit: Payer: Self-pay | Admitting: *Deleted

## 2014-10-08 DIAGNOSIS — R928 Other abnormal and inconclusive findings on diagnostic imaging of breast: Secondary | ICD-10-CM

## 2014-10-14 ENCOUNTER — Ambulatory Visit
Admission: RE | Admit: 2014-10-14 | Discharge: 2014-10-14 | Disposition: A | Discharge status: Home / Self Care | Payer: Medicare Other | Source: Ambulatory Visit | Attending: Family Medicine | Admitting: Family Medicine

## 2014-10-14 DIAGNOSIS — R928 Other abnormal and inconclusive findings on diagnostic imaging of breast: Secondary | ICD-10-CM

## 2014-10-14 DIAGNOSIS — N6489 Other specified disorders of breast: Secondary | ICD-10-CM | POA: Diagnosis not present

## 2014-12-17 ENCOUNTER — Ambulatory Visit (INDEPENDENT_AMBULATORY_CARE_PROVIDER_SITE_OTHER): Payer: Medicare Other | Admitting: Family Medicine

## 2014-12-17 ENCOUNTER — Encounter: Payer: Self-pay | Admitting: Family Medicine

## 2014-12-17 VITALS — BP 127/68 | HR 74 | Temp 98.9°F | Ht 65.0 in | Wt 148.0 lb

## 2014-12-17 DIAGNOSIS — I10 Essential (primary) hypertension: Secondary | ICD-10-CM | POA: Diagnosis not present

## 2014-12-17 DIAGNOSIS — E785 Hyperlipidemia, unspecified: Secondary | ICD-10-CM | POA: Diagnosis not present

## 2014-12-17 LAB — HEPATIC FUNCTION PANEL
ALK PHOS: 54 U/L (ref 39–117)
ALT: 17 U/L (ref 0–35)
AST: 23 U/L (ref 0–37)
Albumin: 4.4 g/dL (ref 3.5–5.2)
BILIRUBIN DIRECT: 0.1 mg/dL (ref 0.0–0.3)
TOTAL PROTEIN: 7 g/dL (ref 6.0–8.3)
Total Bilirubin: 0.4 mg/dL (ref 0.2–1.2)

## 2014-12-17 LAB — LIPID PANEL
Cholesterol: 143 mg/dL (ref 0–200)
HDL: 49.5 mg/dL (ref >39.00)
LDL CALC: 67 mg/dL (ref 0–99)
NonHDL: 93.5
TRIGLYCERIDES: 131 mg/dL (ref 0.0–149.0)
Total CHOL/HDL Ratio: 3
VLDL: 26.2 mg/dL (ref 0.0–40.0)

## 2014-12-17 NOTE — Progress Notes (Signed)
Pre visit review using our clinic review tool, if applicable. No additional management support is needed unless otherwise documented below in the visit note. 

## 2014-12-17 NOTE — Progress Notes (Signed)
   Subjective:    Patient ID: Rachael Mitchell, female    DOB: 1945-11-02, 69 y.o.   MRN: [id number]  HPI Here to follow up on lipids and HTN. She is doing well and she feels well.    Review of Systems  Constitutional: Negative.   Respiratory: Negative.   Cardiovascular: Negative.        Objective:   Physical Exam  Constitutional: She is oriented to person, place, and time. She appears well-developed and well-nourished.  Neck: No thyromegaly present.  Cardiovascular: Normal rate, regular rhythm, normal heart sounds and intact distal pulses.   Pulmonary/Chest: Effort normal and breath sounds normal.  Lymphadenopathy:    She has no cervical adenopathy.  Neurological: She is alert and oriented to person, place, and time.          Assessment & Plan:  Her HTN is well controlled. She is exercising and watching her diet. Get fasting labs today to check her lipids.

## 2015-01-01 DIAGNOSIS — L821 Other seborrheic keratosis: Secondary | ICD-10-CM | POA: Diagnosis not present

## 2015-01-01 DIAGNOSIS — D225 Melanocytic nevi of trunk: Secondary | ICD-10-CM | POA: Diagnosis not present

## 2015-01-01 DIAGNOSIS — Z08 Encounter for follow-up examination after completed treatment for malignant neoplasm: Secondary | ICD-10-CM | POA: Diagnosis not present

## 2015-01-01 DIAGNOSIS — Z85828 Personal history of other malignant neoplasm of skin: Secondary | ICD-10-CM | POA: Diagnosis not present

## 2015-02-16 ENCOUNTER — Other Ambulatory Visit: Payer: Self-pay

## 2015-03-12 ENCOUNTER — Encounter: Payer: Self-pay | Admitting: Family Medicine

## 2015-03-12 DIAGNOSIS — H9190 Unspecified hearing loss, unspecified ear: Secondary | ICD-10-CM

## 2015-03-13 NOTE — Telephone Encounter (Signed)
Referral was done  

## 2015-03-17 DIAGNOSIS — H5202 Hypermetropia, left eye: Secondary | ICD-10-CM | POA: Diagnosis not present

## 2015-03-17 DIAGNOSIS — H43813 Vitreous degeneration, bilateral: Secondary | ICD-10-CM | POA: Diagnosis not present

## 2015-03-17 DIAGNOSIS — H43819 Vitreous degeneration, unspecified eye: Secondary | ICD-10-CM | POA: Diagnosis not present

## 2015-03-17 DIAGNOSIS — H52223 Regular astigmatism, bilateral: Secondary | ICD-10-CM | POA: Diagnosis not present

## 2015-04-13 DIAGNOSIS — H903 Sensorineural hearing loss, bilateral: Secondary | ICD-10-CM | POA: Diagnosis not present

## 2015-06-18 ENCOUNTER — Encounter: Payer: Self-pay | Admitting: Family Medicine

## 2015-06-18 ENCOUNTER — Ambulatory Visit (INDEPENDENT_AMBULATORY_CARE_PROVIDER_SITE_OTHER): Payer: Medicare Other | Admitting: Family Medicine

## 2015-06-18 VITALS — BP 135/73 | HR 72 | Temp 98.5°F | Ht 65.0 in | Wt 153.0 lb

## 2015-06-18 DIAGNOSIS — E785 Hyperlipidemia, unspecified: Secondary | ICD-10-CM | POA: Diagnosis not present

## 2015-06-18 DIAGNOSIS — I10 Essential (primary) hypertension: Secondary | ICD-10-CM

## 2015-06-18 DIAGNOSIS — M159 Polyosteoarthritis, unspecified: Secondary | ICD-10-CM

## 2015-06-18 DIAGNOSIS — E559 Vitamin D deficiency, unspecified: Secondary | ICD-10-CM | POA: Diagnosis not present

## 2015-06-18 DIAGNOSIS — G43809 Other migraine, not intractable, without status migrainosus: Secondary | ICD-10-CM

## 2015-06-18 DIAGNOSIS — Z23 Encounter for immunization: Secondary | ICD-10-CM

## 2015-06-18 DIAGNOSIS — M150 Primary generalized (osteo)arthritis: Secondary | ICD-10-CM

## 2015-06-18 LAB — POCT URINALYSIS DIPSTICK
Bilirubin, UA: NEGATIVE
Blood, UA: NEGATIVE
Glucose, UA: NEGATIVE
Ketones, UA: NEGATIVE
LEUKOCYTES UA: NEGATIVE
Nitrite, UA: NEGATIVE
PROTEIN UA: NEGATIVE
Spec Grav, UA: 1.015
UROBILINOGEN UA: 0.2
pH, UA: 7

## 2015-06-18 LAB — CBC WITH DIFFERENTIAL/PLATELET
BASOS ABS: 0.1 10*3/uL (ref 0.0–0.1)
Basophils Relative: 0.8 % (ref 0.0–3.0)
Eosinophils Absolute: 0.1 10*3/uL (ref 0.0–0.7)
Eosinophils Relative: 1.4 % (ref 0.0–5.0)
HEMATOCRIT: 39.8 % (ref 36.0–46.0)
Hemoglobin: 13.6 g/dL (ref 12.0–15.0)
LYMPHS PCT: 36.6 % (ref 12.0–46.0)
Lymphs Abs: 2.6 10*3/uL (ref 0.7–4.0)
MCHC: 34.1 g/dL (ref 30.0–36.0)
MCV: 89.7 fl (ref 78.0–100.0)
MONOS PCT: 7.5 % (ref 3.0–12.0)
Monocytes Absolute: 0.5 10*3/uL (ref 0.1–1.0)
Neutro Abs: 3.8 10*3/uL (ref 1.4–7.7)
Neutrophils Relative %: 53.7 % (ref 43.0–77.0)
Platelets: 404 10*3/uL — ABNORMAL HIGH (ref 150.0–400.0)
RBC: 4.44 Mil/uL (ref 3.87–5.11)
RDW: 12 % (ref 11.5–15.5)
WBC: 7.1 10*3/uL (ref 4.0–10.5)

## 2015-06-18 LAB — BASIC METABOLIC PANEL
BUN: 17 mg/dL (ref 6–23)
CO2: 30 mEq/L (ref 19–32)
CREATININE: 0.71 mg/dL (ref 0.40–1.20)
Calcium: 10.5 mg/dL (ref 8.4–10.5)
Chloride: 101 mEq/L (ref 96–112)
GFR: 86.46 mL/min (ref >60.00)
Glucose, Bld: 103 mg/dL — ABNORMAL HIGH (ref 70–99)
POTASSIUM: 4.3 meq/L (ref 3.5–5.1)
Sodium: 141 mEq/L (ref 135–145)

## 2015-06-18 LAB — HEPATIC FUNCTION PANEL
ALT: 23 U/L (ref 0–35)
AST: 20 U/L (ref 0–37)
Albumin: 4.3 g/dL (ref 3.5–5.2)
Alkaline Phosphatase: 48 U/L (ref 39–117)
BILIRUBIN TOTAL: 0.4 mg/dL (ref 0.2–1.2)
Bilirubin, Direct: 0.1 mg/dL (ref 0.0–0.3)
Total Protein: 6.9 g/dL (ref 6.0–8.3)

## 2015-06-18 LAB — VITAMIN D 25 HYDROXY (VIT D DEFICIENCY, FRACTURES): VITD: 48.18 ng/mL (ref 30.00–100.00)

## 2015-06-18 LAB — LIPID PANEL
CHOL/HDL RATIO: 3
Cholesterol: 162 mg/dL (ref 0–200)
HDL: 50.4 mg/dL (ref >39.00)
LDL Cholesterol: 91 mg/dL (ref 0–99)
NONHDL: 111.53
Triglycerides: 101 mg/dL (ref 0.0–149.0)
VLDL: 20.2 mg/dL (ref 0.0–40.0)

## 2015-06-18 LAB — TSH: TSH: 1.11 u[IU]/mL (ref 0.35–4.50)

## 2015-06-18 MED ORDER — TRIAMTERENE-HCTZ 37.5-25 MG PO TABS
1.0000 | ORAL_TABLET | Freq: Every day | ORAL | Status: DC
Start: 2015-06-18 — End: 2016-06-21

## 2015-06-18 MED ORDER — FENOFIBRATE 160 MG PO TABS: 160.0000 mg | ORAL_TABLET | Freq: Every day | ORAL | Status: DC

## 2015-06-18 MED ORDER — SIMVASTATIN 40 MG PO TABS: 40.0000 mg | ORAL_TABLET | Freq: Every day | ORAL | Status: DC

## 2015-06-18 MED ORDER — SUMATRIPTAN SUCCINATE 100 MG PO TABS: 100.0000 mg | ORAL_TABLET | Freq: Once | ORAL | Status: DC | PRN

## 2015-06-18 NOTE — Progress Notes (Signed)
Pre visit review using our clinic review tool, if applicable. No additional management support is needed unless otherwise documented below in the visit note. 

## 2015-06-18 NOTE — Progress Notes (Signed)
   Subjective:    Patient ID: Rachael Mitchell, female    DOB: May 19, 1946, 69 y.o.   MRN: [id number]  HPI 69 yr old female for follow up. She feels great and has no complaints. Her BP is stable. Her migraines come only about once every 2 months.    Review of Systems  Constitutional: Negative.   HENT: Negative.   Eyes: Negative.   Respiratory: Negative.   Cardiovascular: Negative.   Gastrointestinal: Negative.   Genitourinary: Negative for dysuria, urgency, frequency, hematuria, flank pain, decreased urine volume, enuresis, difficulty urinating, pelvic pain and dyspareunia.  Musculoskeletal: Negative.   Skin: Negative.   Neurological: Negative.   Psychiatric/Behavioral: Negative.        Objective:   Physical Exam  Constitutional: She is oriented to person, place, and time. She appears well-developed and well-nourished. No distress.  HENT:  Head: Normocephalic and atraumatic.  Right Ear: External ear normal.  Left Ear: External ear normal.  Nose: Nose normal.  Mouth/Throat: Oropharynx is clear and moist. No oropharyngeal exudate.  Eyes: Conjunctivae and EOM are normal. Pupils are equal, round, and reactive to light. No scleral icterus.  Neck: Normal range of motion. Neck supple. No JVD present. No thyromegaly present.  Cardiovascular: Normal rate, regular rhythm, normal heart sounds and intact distal pulses.  Exam reveals no gallop and no friction rub.   No murmur heard. EKG normal   Pulmonary/Chest: Effort normal and breath sounds normal. No respiratory distress. She has no wheezes. She has no rales. She exhibits no tenderness.  Abdominal: Soft. Bowel sounds are normal. She exhibits no distension and no mass. There is no tenderness. There is no rebound and no guarding.  Genitourinary:  Breasts and axillae are normal   Musculoskeletal: Normal range of motion. She exhibits no edema or tenderness.  Lymphadenopathy:    She has no cervical adenopathy.  Neurological: She is alert and  oriented to person, place, and time. She has normal reflexes. No cranial nerve deficit. She exhibits normal muscle tone. Coordination normal.  Skin: Skin is warm and dry. No rash noted. No erythema.  Psychiatric: She has a normal mood and affect. Her behavior is normal. Judgment and thought content normal.          Assessment & Plan:  Her HTN is stable. Her migraines are stable. Her osteoarthritis is stable. We will send her for fasting labs today. She has a mammogram coming up soon.

## 2015-06-18 NOTE — Addendum Note (Signed)
Addended by: Aggie Hacker A on: 06/18/2015 11:18 AM   Modules accepted: Orders

## 2015-07-10 ENCOUNTER — Ambulatory Visit (INDEPENDENT_AMBULATORY_CARE_PROVIDER_SITE_OTHER): Payer: Medicare Other | Admitting: Family Medicine

## 2015-07-10 ENCOUNTER — Encounter: Payer: Self-pay | Admitting: Family Medicine

## 2015-07-10 VITALS — BP 157/72 | HR 72 | Temp 98.1°F | Ht 65.0 in | Wt 156.0 lb

## 2015-07-10 DIAGNOSIS — S8002XA Contusion of left knee, initial encounter: Secondary | ICD-10-CM

## 2015-07-10 DIAGNOSIS — S43402A Unspecified sprain of left shoulder joint, initial encounter: Secondary | ICD-10-CM | POA: Diagnosis not present

## 2015-07-10 DIAGNOSIS — S42035A Nondisplaced fracture of lateral end of left clavicle, initial encounter for closed fracture: Secondary | ICD-10-CM | POA: Diagnosis not present

## 2015-07-10 NOTE — Progress Notes (Signed)
   Subjective:    Patient ID: Rachael Mitchell, female    DOB: 02-23-1946, 70 y.o.   MRN: [id number]  HPI Here for injuries sustained on 07-08-15 when she stepped on an object on the floor at her [employer] and fell, striking her left shoulder and left knee on the door frame. Since then she has had some minor swelling and pain in the medial knee, but her main problem has been with the shoulder. She has had significant swelling around the shoulder with some anterior pain. She has been icing the are and taking some Ibuprofen.    Review of Systems  Constitutional: Negative.   Musculoskeletal: Positive for joint swelling and arthralgias. Negative for gait problem.       Objective:   Physical Exam  Constitutional: She appears well-developed and well-nourished.  Cardiovascular: Normal rate, regular rhythm, normal heart sounds and intact distal pulses.   Pulmonary/Chest: Effort normal and breath sounds normal.  Musculoskeletal:  The left shoulder has a lot of swelling superior to the shoulder joint and along the clavicle. She is tender in the anterior shoulder and there is crepitus around the left acromioclavicular joint. ROM is limited by pain. Also the medial left knee is mildly tender but there is minimal swelling and ROM is full          Assessment & Plan:  Knee contusion and shoulder sprain. I am concerned there may be fractures in the clavicle or the acromion. We will have her see the evening walk in clinic at [facility].

## 2015-07-10 NOTE — Progress Notes (Signed)
Pre visit review using our clinic review tool, if applicable. No additional management support is needed unless otherwise documented below in the visit note. 

## 2015-07-20 DIAGNOSIS — S42035A Nondisplaced fracture of lateral end of left clavicle, initial encounter for closed fracture: Secondary | ICD-10-CM | POA: Diagnosis not present

## 2015-07-20 DIAGNOSIS — M25562 Pain in left knee: Secondary | ICD-10-CM | POA: Diagnosis not present

## 2015-07-20 DIAGNOSIS — S8002XA Contusion of left knee, initial encounter: Secondary | ICD-10-CM | POA: Diagnosis not present

## 2015-07-30 ENCOUNTER — Other Ambulatory Visit: Payer: Self-pay | Admitting: Family Medicine

## 2015-08-03 DIAGNOSIS — S42035D Nondisplaced fracture of lateral end of left clavicle, subsequent encounter for fracture with routine healing: Secondary | ICD-10-CM | POA: Diagnosis not present

## 2015-08-27 DIAGNOSIS — S42035S Nondisplaced fracture of lateral end of left clavicle, sequela: Secondary | ICD-10-CM | POA: Diagnosis not present

## 2015-09-16 ENCOUNTER — Other Ambulatory Visit: Payer: Self-pay

## 2015-09-16 DIAGNOSIS — Z1231 Encounter for screening mammogram for malignant neoplasm of breast: Secondary | ICD-10-CM

## 2015-10-05 ENCOUNTER — Ambulatory Visit
Admission: RE | Admit: 2015-10-05 | Discharge: 2015-10-05 | Disposition: A | Discharge status: Home / Self Care | Payer: Medicare Other | Source: Ambulatory Visit

## 2015-10-05 DIAGNOSIS — Z1231 Encounter for screening mammogram for malignant neoplasm of breast: Secondary | ICD-10-CM | POA: Diagnosis not present

## 2015-12-31 DIAGNOSIS — L304 Erythema intertrigo: Secondary | ICD-10-CM | POA: Diagnosis not present

## 2015-12-31 DIAGNOSIS — L820 Inflamed seborrheic keratosis: Secondary | ICD-10-CM | POA: Diagnosis not present

## 2015-12-31 DIAGNOSIS — D1801 Hemangioma of skin and subcutaneous tissue: Secondary | ICD-10-CM | POA: Diagnosis not present

## 2016-03-21 DIAGNOSIS — H1850 Unspecified hereditary corneal dystrophies: Secondary | ICD-10-CM | POA: Diagnosis not present

## 2016-03-21 DIAGNOSIS — H52223 Regular astigmatism, bilateral: Secondary | ICD-10-CM | POA: Diagnosis not present

## 2016-03-21 DIAGNOSIS — H59033 Cystoid macular edema following cataract surgery, bilateral: Secondary | ICD-10-CM | POA: Diagnosis not present

## 2016-03-21 DIAGNOSIS — H5203 Hypermetropia, bilateral: Secondary | ICD-10-CM | POA: Diagnosis not present

## 2016-03-21 DIAGNOSIS — H524 Presbyopia: Secondary | ICD-10-CM | POA: Diagnosis not present

## 2016-06-21 ENCOUNTER — Telehealth: Payer: Self-pay

## 2016-06-21 ENCOUNTER — Encounter: Payer: Self-pay | Admitting: Family Medicine

## 2016-06-21 ENCOUNTER — Ambulatory Visit (INDEPENDENT_AMBULATORY_CARE_PROVIDER_SITE_OTHER): Payer: Medicare Other | Admitting: Family Medicine

## 2016-06-21 VITALS — BP 138/85 | HR 77 | Temp 98.5°F | Ht 65.0 in | Wt 159.0 lb

## 2016-06-21 DIAGNOSIS — M899 Disorder of bone, unspecified: Secondary | ICD-10-CM

## 2016-06-21 DIAGNOSIS — Z209 Contact with and (suspected) exposure to unspecified communicable disease: Secondary | ICD-10-CM | POA: Diagnosis not present

## 2016-06-21 DIAGNOSIS — M159 Polyosteoarthritis, unspecified: Secondary | ICD-10-CM

## 2016-06-21 DIAGNOSIS — M150 Primary generalized (osteo)arthritis: Secondary | ICD-10-CM | POA: Diagnosis not present

## 2016-06-21 DIAGNOSIS — Z23 Encounter for immunization: Secondary | ICD-10-CM | POA: Diagnosis not present

## 2016-06-21 DIAGNOSIS — M949 Disorder of cartilage, unspecified: Secondary | ICD-10-CM

## 2016-06-21 DIAGNOSIS — E785 Hyperlipidemia, unspecified: Secondary | ICD-10-CM

## 2016-06-21 DIAGNOSIS — G43809 Other migraine, not intractable, without status migrainosus: Secondary | ICD-10-CM

## 2016-06-21 DIAGNOSIS — I10 Essential (primary) hypertension: Secondary | ICD-10-CM | POA: Diagnosis not present

## 2016-06-21 DIAGNOSIS — E559 Vitamin D deficiency, unspecified: Secondary | ICD-10-CM | POA: Diagnosis not present

## 2016-06-21 LAB — HEPATIC FUNCTION PANEL
ALBUMIN: 4.5 g/dL (ref 3.5–5.2)
ALT: 25 U/L (ref 0–35)
AST: 18 U/L (ref 0–37)
Alkaline Phosphatase: 53 U/L (ref 39–117)
BILIRUBIN DIRECT: 0.1 mg/dL (ref 0.0–0.3)
TOTAL PROTEIN: 6.8 g/dL (ref 6.0–8.3)
Total Bilirubin: 0.4 mg/dL (ref 0.2–1.2)

## 2016-06-21 LAB — LIPID PANEL
CHOLESTEROL: 141 mg/dL (ref 0–200)
HDL: 48.1 mg/dL (ref >39.00)
LDL Cholesterol: 71 mg/dL (ref 0–99)
NONHDL: 93.32
Total CHOL/HDL Ratio: 3
Triglycerides: 111 mg/dL (ref 0.0–149.0)
VLDL: 22.2 mg/dL (ref 0.0–40.0)

## 2016-06-21 LAB — TSH: TSH: 1.11 u[IU]/mL (ref 0.35–4.50)

## 2016-06-21 LAB — POC URINALSYSI DIPSTICK (AUTOMATED)
Bilirubin, UA: NEGATIVE
Glucose, UA: NEGATIVE
Ketones, UA: NEGATIVE
Leukocytes, UA: NEGATIVE
NITRITE UA: NEGATIVE
PROTEIN UA: NEGATIVE
RBC UA: NEGATIVE
SPEC GRAV UA: 1.015
UROBILINOGEN UA: 0.2
pH, UA: 8.5

## 2016-06-21 LAB — CBC WITH DIFFERENTIAL/PLATELET
Basophils Absolute: 0 10*3/uL (ref 0.0–0.1)
Basophils Relative: 0.6 % (ref 0.0–3.0)
EOS ABS: 0.1 10*3/uL (ref 0.0–0.7)
Eosinophils Relative: 1.7 % (ref 0.0–5.0)
HCT: 39.9 % (ref 36.0–46.0)
HEMOGLOBIN: 13.9 g/dL (ref 12.0–15.0)
Lymphocytes Relative: 37.7 % (ref 12.0–46.0)
Lymphs Abs: 2.7 10*3/uL (ref 0.7–4.0)
MCHC: 34.9 g/dL (ref 30.0–36.0)
MCV: 89.5 fl (ref 78.0–100.0)
MONO ABS: 0.5 10*3/uL (ref 0.1–1.0)
Monocytes Relative: 7.5 % (ref 3.0–12.0)
Neutro Abs: 3.8 10*3/uL (ref 1.4–7.7)
Neutrophils Relative %: 52.5 % (ref 43.0–77.0)
Platelets: 395 10*3/uL (ref 150.0–400.0)
RBC: 4.46 Mil/uL (ref 3.87–5.11)
RDW: 12.2 % (ref 11.5–15.5)
WBC: 7.2 10*3/uL (ref 4.0–10.5)

## 2016-06-21 LAB — BASIC METABOLIC PANEL
BUN: 16 mg/dL (ref 6–23)
CHLORIDE: 101 meq/L (ref 96–112)
CO2: 28 meq/L (ref 19–32)
CREATININE: 0.73 mg/dL (ref 0.40–1.20)
Calcium: 10.2 mg/dL (ref 8.4–10.5)
GFR: 83.49 mL/min (ref >60.00)
Glucose, Bld: 100 mg/dL — ABNORMAL HIGH (ref 70–99)
Potassium: 4.5 mEq/L (ref 3.5–5.1)
Sodium: 138 mEq/L (ref 135–145)

## 2016-06-21 LAB — VITAMIN D 25 HYDROXY (VIT D DEFICIENCY, FRACTURES): VITD: 45.75 ng/mL (ref 30.00–100.00)

## 2016-06-21 MED ORDER — FENOFIBRATE 160 MG PO TABS: 160.0000 mg | ORAL_TABLET | Freq: Every day | ORAL | 3 refills | Status: DC

## 2016-06-21 MED ORDER — SUMATRIPTAN SUCCINATE 100 MG PO TABS: 100.0000 mg | ORAL_TABLET | Freq: Once | ORAL | 3 refills | Status: DC | PRN

## 2016-06-21 MED ORDER — SIMVASTATIN 40 MG PO TABS: 40.0000 mg | ORAL_TABLET | Freq: Every day | ORAL | 3 refills | Status: DC

## 2016-06-21 MED ORDER — DICLOFENAC SODIUM 3 % TD GEL: 1.0000 "application " | Freq: Two times a day (BID) | TRANSDERMAL | 5 refills | Status: DC | PRN

## 2016-06-21 MED ORDER — TRIAMTERENE-HCTZ 37.5-25 MG PO TABS: 1.0000 | ORAL_TABLET | Freq: Every day | ORAL | 3 refills | Status: DC

## 2016-06-21 NOTE — Progress Notes (Signed)
Pre visit review using our clinic review tool, if applicable. No additional management support is needed unless otherwise documented below in the visit note. 

## 2016-06-21 NOTE — Telephone Encounter (Signed)
PA denied because the diagnosis of osteoarthritis is considered an experimental or investigational use for this drug.   Is there another dx I can use?

## 2016-06-21 NOTE — Progress Notes (Signed)
   Subjective:    Patient ID: Rachael Mitchell, female    DOB: 1946-06-25, 70 y.o.   MRN: [id number]  HPI 70 yr old female to follow up on issues. Her arthritis pain has gotten worse in the hands, and she often has limited use of the right hand due to pain and stiffness. She exercises almost every day in the gym, and she goes to water therapy twice a week. Her BP is stable. She averages 2-5 migraines a month but Imitrex almost always takes care of these quickly.    Review of Systems  Constitutional: Negative.   HENT: Negative.   Eyes: Negative.   Respiratory: Negative.   Cardiovascular: Negative.   Gastrointestinal: Negative.   Genitourinary: Negative for decreased urine volume, difficulty urinating, dyspareunia, dysuria, enuresis, flank pain, frequency, hematuria, pelvic pain and urgency.  Musculoskeletal: Negative.   Skin: Negative.   Neurological: Negative.   Psychiatric/Behavioral: Negative.        Objective:   Physical Exam  Constitutional: She is oriented to person, place, and time. She appears well-developed and well-nourished. No distress.  HENT:  Head: Normocephalic and atraumatic.  Right Ear: External ear normal.  Left Ear: External ear normal.  Nose: Nose normal.  Mouth/Throat: Oropharynx is clear and moist. No oropharyngeal exudate.  Eyes: Conjunctivae and EOM are normal. Pupils are equal, round, and reactive to light. No scleral icterus.  Neck: Normal range of motion. Neck supple. No JVD present. No thyromegaly present.  Cardiovascular: Normal rate, regular rhythm, normal heart sounds and intact distal pulses.  Exam reveals no gallop and no friction rub.   No murmur heard. EKG normal   Pulmonary/Chest: Effort normal and breath sounds normal. No respiratory distress. She has no wheezes. She has no rales. She exhibits no tenderness.  Abdominal: Soft. Bowel sounds are normal. She exhibits no distension and no mass. There is no tenderness. There is no rebound and no  guarding.  Musculoskeletal: Normal range of motion. She exhibits no edema or tenderness.  Lymphadenopathy:    She has no cervical adenopathy.  Neurological: She is alert and oriented to person, place, and time. She has normal reflexes. No cranial nerve deficit. She exhibits normal muscle tone. Coordination normal.  Skin: Skin is warm and dry. No rash noted. No erythema.  Psychiatric: She has a normal mood and affect. Her behavior is normal. Judgment and thought content normal.          Assessment & Plan:  Her HTN is stable. We will check her lipids along with other things by getting fasting labs today. Set up another DEXA to follow osteoporosis. Try Diclofenac gel for the arthritis pain in her hands.  Laurey Morale, MD

## 2016-06-21 NOTE — Telephone Encounter (Signed)
Received PA request from [facility] for Diclofenac Sodium gel. PA submitted & is pending. Key: [id number]

## 2016-06-22 LAB — HEPATITIS C ANTIBODY: HCV AB: NEGATIVE

## 2016-06-22 NOTE — Telephone Encounter (Signed)
Try the diagnosis of bilateral hand pain M79.64

## 2016-06-23 NOTE — Telephone Encounter (Signed)
PA re-submitted with new dx code.

## 2016-06-23 NOTE — Telephone Encounter (Signed)
I suppose she will have to pay cash, thanks anyway

## 2016-06-23 NOTE — Telephone Encounter (Signed)
PA denied again:  the use of this medication for the treatment of pain is considered an experimental or investigational indication for this drug. Experimental or investigational indications are those which further studies or clinical trials are necessary to determine the maximum tolerated dose, toxicity, safety, efficacy, or efficacy as compared with the standard means of treatment.

## 2016-06-24 ENCOUNTER — Ambulatory Visit (INDEPENDENT_AMBULATORY_CARE_PROVIDER_SITE_OTHER)
Admission: RE | Admit: 2016-06-24 | Discharge: 2016-06-24 | Disposition: A | Discharge status: Home / Self Care | Payer: Medicare Other | Source: Ambulatory Visit | Attending: Family Medicine | Admitting: Family Medicine

## 2016-06-24 DIAGNOSIS — M899 Disorder of bone, unspecified: Secondary | ICD-10-CM | POA: Diagnosis not present

## 2016-06-24 DIAGNOSIS — M949 Disorder of cartilage, unspecified: Secondary | ICD-10-CM | POA: Diagnosis not present

## 2016-06-24 NOTE — Telephone Encounter (Signed)
I spoke with pt and she will check on cost out of pocket.

## 2016-06-28 ENCOUNTER — Telehealth: Payer: Self-pay | Admitting: Family Medicine

## 2016-06-28 NOTE — Telephone Encounter (Signed)
Request from [facility], Voltaren 3% gel was $750 and the 1% gel is less than $50 a tube. I spoke with pt and she would like to try this.

## 2016-06-28 NOTE — Telephone Encounter (Signed)
Per Dr. Sarajane Jews order Voltaren 1% gel apply bid prn.

## 2016-06-30 MED ORDER — DICLOFENAC SODIUM 1 % TD GEL: 1.0000 "application " | Freq: Four times a day (QID) | TRANSDERMAL | 3 refills | Status: DC

## 2016-09-06 ENCOUNTER — Other Ambulatory Visit: Payer: Self-pay | Admitting: Family Medicine

## 2016-09-06 DIAGNOSIS — Z1231 Encounter for screening mammogram for malignant neoplasm of breast: Secondary | ICD-10-CM

## 2016-10-07 ENCOUNTER — Ambulatory Visit
Admission: RE | Admit: 2016-10-07 | Discharge: 2016-10-07 | Disposition: A | Discharge status: Home / Self Care | Payer: Medicare Other | Source: Ambulatory Visit | Attending: Family Medicine | Admitting: Family Medicine

## 2016-10-07 DIAGNOSIS — Z1231 Encounter for screening mammogram for malignant neoplasm of breast: Secondary | ICD-10-CM

## 2016-12-19 ENCOUNTER — Ambulatory Visit (INDEPENDENT_AMBULATORY_CARE_PROVIDER_SITE_OTHER): Payer: Medicare Other | Admitting: Family Medicine

## 2016-12-19 ENCOUNTER — Encounter: Payer: Self-pay | Admitting: Family Medicine

## 2016-12-19 VITALS — BP 126/76 | HR 73 | Temp 97.9°F | Ht 65.0 in | Wt 145.0 lb

## 2016-12-19 DIAGNOSIS — R739 Hyperglycemia, unspecified: Secondary | ICD-10-CM | POA: Diagnosis not present

## 2016-12-19 DIAGNOSIS — E782 Mixed hyperlipidemia: Secondary | ICD-10-CM

## 2016-12-19 DIAGNOSIS — M159 Polyosteoarthritis, unspecified: Secondary | ICD-10-CM

## 2016-12-19 DIAGNOSIS — I10 Essential (primary) hypertension: Secondary | ICD-10-CM | POA: Diagnosis not present

## 2016-12-19 DIAGNOSIS — M150 Primary generalized (osteo)arthritis: Secondary | ICD-10-CM | POA: Diagnosis not present

## 2016-12-19 LAB — HEMOGLOBIN A1C: Hgb A1c MFr Bld: 5.7 % (ref 4.6–6.5)

## 2016-12-19 LAB — LIPID PANEL
CHOLESTEROL: 150 mg/dL (ref 0–200)
HDL: 52.6 mg/dL (ref >39.00)
LDL Cholesterol: 82 mg/dL (ref 0–99)
NonHDL: 97.46
Total CHOL/HDL Ratio: 3
Triglycerides: 79 mg/dL (ref 0.0–149.0)
VLDL: 15.8 mg/dL (ref 0.0–40.0)

## 2016-12-19 LAB — HEPATIC FUNCTION PANEL
ALBUMIN: 4.4 g/dL (ref 3.5–5.2)
ALT: 18 U/L (ref 0–35)
AST: 18 U/L (ref 0–37)
Alkaline Phosphatase: 57 U/L (ref 39–117)
BILIRUBIN DIRECT: 0.1 mg/dL (ref 0.0–0.3)
BILIRUBIN TOTAL: 0.4 mg/dL (ref 0.2–1.2)
Total Protein: 6.7 g/dL (ref 6.0–8.3)

## 2016-12-19 NOTE — Progress Notes (Signed)
   Subjective:    Patient ID: Rachael Mitchell, female    DOB: 1945-10-10, 71 y.o.   MRN: [id number]  HPI Here to follow up. She feels good and she has lost about 5 lbs. She is doing water exercises 2-3 days a week. Her joint pains are manageable.    Review of Systems  Constitutional: Negative.   Respiratory: Negative.   Cardiovascular: Negative.   Musculoskeletal: Positive for arthralgias.  Neurological: Negative.        Objective:   Physical Exam  Constitutional: She is oriented to person, place, and time. She appears well-developed and well-nourished.  Neck: No thyromegaly present.  Cardiovascular: Normal rate, regular rhythm, normal heart sounds and intact distal pulses.   Pulmonary/Chest: Effort normal and breath sounds normal.  Lymphadenopathy:    She has no cervical adenopathy.  Neurological: She is alert and oriented to person, place, and time.          Assessment & Plan:  Her HTN is well controlled. OA is stable. Her migaines are stable. Get fasting labs to follow up glucose and lipids.  Alysia Penna, MD

## 2016-12-19 NOTE — Patient Instructions (Signed)
WE NOW OFFER   [facility] FAST TRACK!!!  SAME DAY Appointments for ACUTE CARE  Such as: Sprains, Injuries, cuts, abrasions, rashes, muscle pain, joint pain, back pain Colds, flu, sore throats, headache, allergies, cough, fever  Ear pain, sinus and eye infections Abdominal pain, nausea, vomiting, diarrhea, upset stomach Animal/insect bites  3 Easy Ways to Schedule: Walk-In Scheduling Call in scheduling Mychart Sign-up: [email or web address]         

## 2016-12-19 NOTE — Progress Notes (Signed)
Pre visit review using our clinic review tool, if applicable. No additional management support is needed unless otherwise documented below in the visit note. 

## 2017-01-09 DIAGNOSIS — Z85828 Personal history of other malignant neoplasm of skin: Secondary | ICD-10-CM | POA: Diagnosis not present

## 2017-01-09 DIAGNOSIS — D1801 Hemangioma of skin and subcutaneous tissue: Secondary | ICD-10-CM | POA: Diagnosis not present

## 2017-01-09 DIAGNOSIS — L309 Dermatitis, unspecified: Secondary | ICD-10-CM | POA: Diagnosis not present

## 2017-01-09 DIAGNOSIS — L821 Other seborrheic keratosis: Secondary | ICD-10-CM | POA: Diagnosis not present

## 2017-01-09 DIAGNOSIS — L814 Other melanin hyperpigmentation: Secondary | ICD-10-CM | POA: Diagnosis not present

## 2017-01-09 DIAGNOSIS — L570 Actinic keratosis: Secondary | ICD-10-CM | POA: Diagnosis not present

## 2017-01-09 DIAGNOSIS — L304 Erythema intertrigo: Secondary | ICD-10-CM | POA: Diagnosis not present

## 2017-01-09 DIAGNOSIS — D225 Melanocytic nevi of trunk: Secondary | ICD-10-CM | POA: Diagnosis not present

## 2017-03-22 DIAGNOSIS — H5203 Hypermetropia, bilateral: Secondary | ICD-10-CM | POA: Diagnosis not present

## 2017-03-22 DIAGNOSIS — H04123 Dry eye syndrome of bilateral lacrimal glands: Secondary | ICD-10-CM | POA: Diagnosis not present

## 2017-03-22 DIAGNOSIS — H52223 Regular astigmatism, bilateral: Secondary | ICD-10-CM | POA: Diagnosis not present

## 2017-03-22 DIAGNOSIS — H524 Presbyopia: Secondary | ICD-10-CM | POA: Diagnosis not present

## 2017-05-11 ENCOUNTER — Encounter: Payer: Self-pay | Admitting: Family Medicine

## 2017-06-23 ENCOUNTER — Encounter: Payer: Medicare Other | Admitting: Family Medicine

## 2017-07-10 ENCOUNTER — Ambulatory Visit (INDEPENDENT_AMBULATORY_CARE_PROVIDER_SITE_OTHER): Payer: Medicare Other | Admitting: Family Medicine

## 2017-07-10 ENCOUNTER — Encounter: Payer: Self-pay | Admitting: Family Medicine

## 2017-07-10 VITALS — BP 140/90 | HR 75 | Temp 98.3°F | Ht 66.0 in | Wt 158.6 lb

## 2017-07-10 DIAGNOSIS — E559 Vitamin D deficiency, unspecified: Secondary | ICD-10-CM | POA: Diagnosis not present

## 2017-07-10 DIAGNOSIS — R739 Hyperglycemia, unspecified: Secondary | ICD-10-CM | POA: Diagnosis not present

## 2017-07-10 DIAGNOSIS — M159 Polyosteoarthritis, unspecified: Secondary | ICD-10-CM

## 2017-07-10 DIAGNOSIS — Z23 Encounter for immunization: Secondary | ICD-10-CM | POA: Diagnosis not present

## 2017-07-10 DIAGNOSIS — M150 Primary generalized (osteo)arthritis: Secondary | ICD-10-CM | POA: Diagnosis not present

## 2017-07-10 DIAGNOSIS — G43009 Migraine without aura, not intractable, without status migrainosus: Secondary | ICD-10-CM

## 2017-07-10 DIAGNOSIS — I10 Essential (primary) hypertension: Secondary | ICD-10-CM

## 2017-07-10 DIAGNOSIS — E782 Mixed hyperlipidemia: Secondary | ICD-10-CM

## 2017-07-10 LAB — BASIC METABOLIC PANEL
BUN: 18 mg/dL (ref 6–23)
CALCIUM: 10 mg/dL (ref 8.4–10.5)
CO2: 26 meq/L (ref 19–32)
Chloride: 99 mEq/L (ref 96–112)
Creatinine, Ser: 0.72 mg/dL (ref 0.40–1.20)
GFR: 84.58 mL/min (ref >60.00)
Glucose, Bld: 97 mg/dL (ref 70–99)
Potassium: 4.1 mEq/L (ref 3.5–5.1)
SODIUM: 134 meq/L — AB (ref 135–145)

## 2017-07-10 LAB — CBC WITH DIFFERENTIAL/PLATELET
Basophils Absolute: 0 10*3/uL (ref 0.0–0.1)
Basophils Relative: 0.5 % (ref 0.0–3.0)
EOS ABS: 0.1 10*3/uL (ref 0.0–0.7)
EOS PCT: 1.7 % (ref 0.0–5.0)
HCT: 40 % (ref 36.0–46.0)
HEMOGLOBIN: 13.8 g/dL (ref 12.0–15.0)
LYMPHS ABS: 3 10*3/uL (ref 0.7–4.0)
Lymphocytes Relative: 39.6 % (ref 12.0–46.0)
MCHC: 34.5 g/dL (ref 30.0–36.0)
MCV: 90.7 fl (ref 78.0–100.0)
MONO ABS: 0.5 10*3/uL (ref 0.1–1.0)
Monocytes Relative: 7.2 % (ref 3.0–12.0)
NEUTROS PCT: 51 % (ref 43.0–77.0)
Neutro Abs: 3.9 10*3/uL (ref 1.4–7.7)
Platelets: 390 10*3/uL (ref 150.0–400.0)
RBC: 4.4 Mil/uL (ref 3.87–5.11)
RDW: 12.1 % (ref 11.5–15.5)
WBC: 7.6 10*3/uL (ref 4.0–10.5)

## 2017-07-10 LAB — HEPATIC FUNCTION PANEL
ALBUMIN: 4.4 g/dL (ref 3.5–5.2)
ALT: 27 U/L (ref 0–35)
AST: 21 U/L (ref 0–37)
Alkaline Phosphatase: 48 U/L (ref 39–117)
BILIRUBIN DIRECT: 0.1 mg/dL (ref 0.0–0.3)
TOTAL PROTEIN: 6.8 g/dL (ref 6.0–8.3)
Total Bilirubin: 0.5 mg/dL (ref 0.2–1.2)

## 2017-07-10 LAB — POC URINALSYSI DIPSTICK (AUTOMATED)
Bilirubin, UA: NEGATIVE
Glucose, UA: NEGATIVE
KETONES UA: NEGATIVE
Leukocytes, UA: NEGATIVE
Nitrite, UA: NEGATIVE
PH UA: 7.5 (ref 5.0–8.0)
PROTEIN UA: NEGATIVE
RBC UA: NEGATIVE
SPEC GRAV UA: 1.015 (ref 1.010–1.025)
UROBILINOGEN UA: 0.2 U/dL

## 2017-07-10 LAB — LIPID PANEL
Cholesterol: 139 mg/dL (ref 0–200)
HDL: 44 mg/dL (ref >39.00)
LDL Cholesterol: 62 mg/dL (ref 0–99)
NONHDL: 94.78
Total CHOL/HDL Ratio: 3
Triglycerides: 163 mg/dL — ABNORMAL HIGH (ref 0.0–149.0)
VLDL: 32.6 mg/dL (ref 0.0–40.0)

## 2017-07-10 LAB — VITAMIN D 25 HYDROXY (VIT D DEFICIENCY, FRACTURES): VITD: 43.87 ng/mL (ref 30.00–100.00)

## 2017-07-10 LAB — HEMOGLOBIN A1C: HEMOGLOBIN A1C: 5.7 % (ref 4.6–6.5)

## 2017-07-10 LAB — TSH: TSH: 1.41 u[IU]/mL (ref 0.35–4.50)

## 2017-07-10 MED ORDER — FENOFIBRATE 160 MG PO TABS: 160.0000 mg | ORAL_TABLET | Freq: Every day | ORAL | 3 refills | Status: DC

## 2017-07-10 MED ORDER — TRIAMTERENE-HCTZ 37.5-25 MG PO TABS: 1.0000 | ORAL_TABLET | Freq: Every day | ORAL | 3 refills | Status: DC

## 2017-07-10 MED ORDER — SIMVASTATIN 40 MG PO TABS: 40.0000 mg | ORAL_TABLET | Freq: Every day | ORAL | 3 refills | Status: DC

## 2017-07-10 MED ORDER — DICLOFENAC SODIUM 1 % TD GEL: 1.0000 "application " | Freq: Four times a day (QID) | TRANSDERMAL | 3 refills | Status: DC

## 2017-07-10 MED ORDER — SUMATRIPTAN SUCCINATE 100 MG PO TABS: 100.0000 mg | ORAL_TABLET | Freq: Once | ORAL | 3 refills | Status: DC | PRN

## 2017-07-10 MED ORDER — AMLODIPINE BESYLATE 5 MG PO TABS: 5.0000 mg | ORAL_TABLET | Freq: Every day | ORAL | 3 refills | Status: DC

## 2017-07-10 NOTE — Progress Notes (Signed)
   Subjective:    Patient ID: Rachael Mitchell, female    DOB: [birth year], 71 y.o.   MRN: [id number]  HPI Here to follow up on issues. Her main complaint is generalized joint pains. She takes Ibuprofen and Diclofenac gel with mixed results. She adds glucosamine. She asks if she should try turmeric as well. Her migraines have been a little more frequent, though Imitrex still works well to stop them. Her BP has been running high at home with systolic readings in the 578I and 150s.    Review of Systems  Constitutional: Negative.   HENT: Negative.   Eyes: Negative.   Respiratory: Negative.   Cardiovascular: Negative.   Gastrointestinal: Negative.   Genitourinary: Negative for decreased urine volume, difficulty urinating, dyspareunia, dysuria, enuresis, flank pain, frequency, hematuria, pelvic pain and urgency.  Musculoskeletal: Positive for arthralgias. Negative for back pain, gait problem, joint swelling, myalgias, neck pain and neck stiffness.  Skin: Negative.   Neurological: Positive for headaches. Negative for dizziness, tremors, seizures, syncope, facial asymmetry, speech difficulty, weakness, light-headedness and numbness.  Psychiatric/Behavioral: Negative.        Objective:   Physical Exam  Constitutional: She is oriented to person, place, and time. She appears well-developed and well-nourished. No distress.  HENT:  Head: Normocephalic and atraumatic.  Right Ear: External ear normal.  Left Ear: External ear normal.  Nose: Nose normal.  Mouth/Throat: Oropharynx is clear and moist. No oropharyngeal exudate.  Eyes: Conjunctivae and EOM are normal. Pupils are equal, round, and reactive to light. No scleral icterus.  Neck: Normal range of motion. Neck supple. No JVD present. No thyromegaly present.  Cardiovascular: Normal rate, regular rhythm, normal heart sounds and intact distal pulses. Exam reveals no gallop and no friction rub.  No murmur heard. Pulmonary/Chest: Effort normal and  breath sounds normal. No respiratory distress. She has no wheezes. She has no rales. She exhibits no tenderness.  Abdominal: Soft. Bowel sounds are normal. She exhibits no distension and no mass. There is no tenderness. There is no rebound and no guarding.  Musculoskeletal: Normal range of motion. She exhibits no edema or tenderness.  Lymphadenopathy:    She has no cervical adenopathy.  Neurological: She is alert and oriented to person, place, and time. She has normal reflexes. No cranial nerve deficit. She exhibits normal muscle tone. Coordination normal.  Skin: Skin is warm and dry. No rash noted. No erythema.  Psychiatric: She has a normal mood and affect. Her behavior is normal. Judgment and thought content normal.          Assessment & Plan:  Her HTN has been slightly out of control so we will add Amlodipine 5 mg daily to her regimen. Hopefully this may help prevent some migraines as well. We discussed diet and exercise. I suggested she add turmeric OTC to er regimen for joint pains. She will be due for another colonoscopy in January. Get fasting labs today.  Alysia Penna, MD

## 2017-09-15 ENCOUNTER — Encounter: Payer: Self-pay | Admitting: *Deleted

## 2017-09-19 ENCOUNTER — Other Ambulatory Visit: Payer: Self-pay | Admitting: Family Medicine

## 2017-09-19 DIAGNOSIS — Z1231 Encounter for screening mammogram for malignant neoplasm of breast: Secondary | ICD-10-CM

## 2017-09-26 ENCOUNTER — Encounter: Payer: Self-pay | Admitting: Internal Medicine

## 2017-09-27 ENCOUNTER — Encounter: Payer: Self-pay | Admitting: Internal Medicine

## 2017-10-09 ENCOUNTER — Ambulatory Visit
Admission: RE | Admit: 2017-10-09 | Discharge: 2017-10-09 | Disposition: A | Discharge status: Home / Self Care | Payer: Medicare Other | Source: Ambulatory Visit | Attending: Family Medicine | Admitting: Family Medicine

## 2017-10-09 DIAGNOSIS — Z1231 Encounter for screening mammogram for malignant neoplasm of breast: Secondary | ICD-10-CM | POA: Diagnosis not present

## 2017-11-13 ENCOUNTER — Ambulatory Visit (AMBULATORY_SURGERY_CENTER): Payer: Self-pay | Admitting: *Deleted

## 2017-11-13 ENCOUNTER — Other Ambulatory Visit: Payer: Self-pay

## 2017-11-13 VITALS — Ht 66.0 in | Wt 153.0 lb

## 2017-11-13 DIAGNOSIS — Z86010 Personal history of colonic polyps: Secondary | ICD-10-CM

## 2017-11-13 MED ORDER — NA SULFATE-K SULFATE-MG SULF 17.5-3.13-1.6 GM/177ML PO SOLN: ORAL | 0 refills | Status: DC

## 2017-11-13 NOTE — Progress Notes (Signed)
No egg or soy allergy  No diet medication taken  No trouble moving neck  Registered in [place]  No sleep apnea or home oxygen used

## 2017-11-27 ENCOUNTER — Other Ambulatory Visit: Payer: Self-pay

## 2017-11-27 ENCOUNTER — Ambulatory Visit (AMBULATORY_SURGERY_CENTER): Payer: Medicare Other | Admitting: Internal Medicine

## 2017-11-27 ENCOUNTER — Encounter: Payer: Self-pay | Admitting: Internal Medicine

## 2017-11-27 VITALS — BP 116/70 | HR 64 | Temp 98.4°F | Resp 11 | Ht 66.0 in | Wt 158.0 lb

## 2017-11-27 DIAGNOSIS — K635 Polyp of colon: Secondary | ICD-10-CM | POA: Diagnosis not present

## 2017-11-27 DIAGNOSIS — I10 Essential (primary) hypertension: Secondary | ICD-10-CM | POA: Diagnosis not present

## 2017-11-27 DIAGNOSIS — Z86010 Personal history of colonic polyps: Secondary | ICD-10-CM | POA: Diagnosis not present

## 2017-11-27 DIAGNOSIS — D125 Benign neoplasm of sigmoid colon: Secondary | ICD-10-CM | POA: Diagnosis not present

## 2017-11-27 HISTORY — PX: OTHER SURGICAL HISTORY: SHX169

## 2017-11-27 MED ORDER — SODIUM CHLORIDE 0.9 % IV SOLN: 500.0000 mL | Freq: Once | INTRAVENOUS | Status: DC

## 2017-11-27 NOTE — Patient Instructions (Addendum)
Handouts given on polyps and diverticulosis  YOU HAD AN ENDOSCOPIC PROCEDURE TODAY: Refer to the procedure report and other information in the discharge instructions given to you for any specific questions about what was found during the examination. If this information does not answer your questions, please call [facility] office at [phone] to clarify.   YOU SHOULD EXPECT: Some feelings of bloating in the abdomen. Passage of more gas than usual. Walking can help get rid of the air that was put into your GI tract during the procedure and reduce the bloating. If you had a lower endoscopy (such as a colonoscopy or flexible sigmoidoscopy) you may notice spotting of blood in your stool or on the toilet paper. Some abdominal soreness may be present for a day or two, also.  DIET: Your first meal following the procedure should be a light meal and then it is ok to progress to your normal diet. A half-sandwich or bowl of soup is an example of a good first meal. Heavy or fried foods are harder to digest and may make you feel nauseous or bloated. Drink plenty of fluids but you should avoid alcoholic beverages for 24 hours. If you had a esophageal dilation, please see attached instructions for diet.    ACTIVITY: Your care partner should take you home directly after the procedure. You should plan to take it easy, moving slowly for the rest of the day. You can resume normal activity the day after the procedure however YOU SHOULD NOT DRIVE, use power tools, machinery or perform tasks that involve climbing or major physical exertion for 24 hours (because of the sedation medicines used during the test).   SYMPTOMS TO REPORT IMMEDIATELY: A gastroenterologist can be reached at any hour. Please call [phone]  for any of the following symptoms:  Following lower endoscopy (colonoscopy, flexible sigmoidoscopy) Excessive amounts of blood in the stool  Significant tenderness, worsening of abdominal pains  Swelling of  the abdomen that is new, acute  Fever of 100 or higher    FOLLOW UP:  If any biopsies were taken you will be contacted by phone or by letter within the next 1-3 weeks. Call [phone]  if you have not heard about the biopsies in 3 weeks.  Please also call with any specific questions about appointments or follow up tests.  

## 2017-11-27 NOTE — Progress Notes (Signed)
Called to room to assist during endoscopic procedure.  Patient ID and intended procedure confirmed with present staff. Received instructions for my participation in the procedure from the performing physician.  

## 2017-11-27 NOTE — Op Note (Signed)
[place] Patient Name: Rachael Mitchell Procedure Date: 11/27/2017 8:11 AM MRN: [id number] Endoscopist: Jerene Bears , MD Age: 72 Referring MD:  Date of Birth: [birth year] Gender: Female Account #: [id number] Procedure:                Colonoscopy Indications:              Surveillance: Personal history of adenomatous                            polyps on last colonoscopy 3 years ago Medicines:                Monitored Anesthesia Care Procedure:                Pre-Anesthesia Assessment:                           - Prior to the procedure, a History and Physical                            was performed, and patient medications and                            allergies were reviewed. The patient's tolerance of                            previous anesthesia was also reviewed. The risks                            and benefits of the procedure and the sedation                            options and risks were discussed with the patient.                            All questions were answered, and informed consent                            was obtained. Prior Anticoagulants: The patient has                            taken no previous anticoagulant or antiplatelet                            agents. ASA Grade Assessment: II - A patient with                            mild systemic disease. After reviewing the risks                            and benefits, the patient was deemed in                            satisfactory condition to undergo the procedure.  After obtaining informed consent, the colonoscope                            was passed under direct vision. Throughout the                            procedure, the patient's blood pressure, pulse, and                            oxygen saturations were monitored continuously. The                            Colonoscope was introduced through the anus and                            advanced to the the cecum,  identified by                            appendiceal orifice and ileocecal valve. The                            colonoscopy was performed without difficulty. The                            patient tolerated the procedure well. The quality                            of the bowel preparation was good. The ileocecal                            valve, appendiceal orifice, and rectum were                            photographed. Scope In: 8:13:24 AM Scope Out: 8:27:35 AM Scope Withdrawal Time: 0 hours 11 minutes 18 seconds  Total Procedure Duration: 0 hours 14 minutes 11 seconds  Findings:                 The digital rectal exam was normal.                           A 5 mm polyp was found in the sigmoid colon. The                            polyp was sessile. The polyp was removed with a                            cold snare. Resection and retrieval were complete.                           Multiple small and large-mouthed diverticula were                            found in the sigmoid colon and descending colon.  The retroflexed view of the distal rectum and anal                            verge was normal and showed no anal or rectal                            abnormalities. Complications:            No immediate complications. Estimated Blood Loss:     Estimated blood loss was minimal. Impression:               - One 5 mm polyp in the sigmoid colon, removed with                            a cold snare. Resected and retrieved.                           - Moderate diverticulosis in the sigmoid colon and                            in the descending colon.                           - The distal rectum and anal verge are normal on                            retroflexion view. Recommendation:           - Patient has a contact number available for                            emergencies. The signs and symptoms of potential                            delayed complications  were discussed with the                            patient. Return to normal activities tomorrow.                            Written discharge instructions were provided to the                            patient.                           - Resume previous diet.                           - Continue present medications.                           - Await pathology results.                           - Repeat colonoscopy in 5 years for surveillance. Jerene Bears, MD 11/27/2017  8:35:47 AM This report has been signed electronically.

## 2017-11-27 NOTE — Progress Notes (Signed)
Report given to PACU, vss 

## 2017-11-27 NOTE — Progress Notes (Signed)
Pt's states no medical or surgical changes since previsit or office visit. 

## 2017-11-28 ENCOUNTER — Telehealth: Payer: Self-pay | Admitting: *Deleted

## 2017-11-28 NOTE — Telephone Encounter (Signed)
  Follow up Call-  Call back number 11/27/2017  Post procedure Call Back phone  # [phone]  Permission to leave phone message Yes  Some recent data might be hidden     Patient questions:  Do you have a fever, pain , or abdominal swelling? No. Pain Score  0 *  Have you tolerated food without any problems? Yes.    Have you been able to return to your normal activities? Yes.    Do you have any questions about your discharge instructions: Diet   No. Medications  No. Follow up visit  No.  Do you have questions or concerns about your Care? No.  Actions: * If pain score is 4 or above: No action needed, pain <4.

## 2017-11-30 ENCOUNTER — Encounter: Payer: Self-pay | Admitting: Internal Medicine

## 2018-01-08 ENCOUNTER — Encounter: Payer: Self-pay | Admitting: Family Medicine

## 2018-01-08 ENCOUNTER — Ambulatory Visit (INDEPENDENT_AMBULATORY_CARE_PROVIDER_SITE_OTHER): Payer: Medicare Other | Admitting: Family Medicine

## 2018-01-08 VITALS — BP 122/82 | HR 68 | Temp 97.7°F | Wt 148.4 lb

## 2018-01-08 DIAGNOSIS — G43009 Migraine without aura, not intractable, without status migrainosus: Secondary | ICD-10-CM | POA: Diagnosis not present

## 2018-01-08 DIAGNOSIS — E782 Mixed hyperlipidemia: Secondary | ICD-10-CM | POA: Diagnosis not present

## 2018-01-08 DIAGNOSIS — I10 Essential (primary) hypertension: Secondary | ICD-10-CM

## 2018-01-08 DIAGNOSIS — R739 Hyperglycemia, unspecified: Secondary | ICD-10-CM

## 2018-01-08 LAB — HEPATIC FUNCTION PANEL
ALK PHOS: 43 U/L (ref 39–117)
ALT: 19 U/L (ref 0–35)
AST: 17 U/L (ref 0–37)
Albumin: 4.4 g/dL (ref 3.5–5.2)
BILIRUBIN DIRECT: 0.1 mg/dL (ref 0.0–0.3)
BILIRUBIN TOTAL: 0.4 mg/dL (ref 0.2–1.2)
Total Protein: 7 g/dL (ref 6.0–8.3)

## 2018-01-08 LAB — LIPID PANEL
CHOL/HDL RATIO: 3
Cholesterol: 130 mg/dL (ref 0–200)
HDL: 46.9 mg/dL (ref >39.00)
LDL Cholesterol: 66 mg/dL (ref 0–99)
NONHDL: 83.27
TRIGLYCERIDES: 88 mg/dL (ref 0.0–149.0)
VLDL: 17.6 mg/dL (ref 0.0–40.0)

## 2018-01-08 LAB — HEMOGLOBIN A1C: Hgb A1c MFr Bld: 5.6 % (ref 4.6–6.5)

## 2018-01-08 NOTE — Progress Notes (Signed)
   Subjective:    Patient ID: Rachael Mitchell, female    DOB: 05/09/1946, 72 y.o.   MRN: [id number]  HPI Here to follow up. She feels great. Her BP is stable and her migraines are much less frequent.    Review of Systems  Constitutional: Negative.   Respiratory: Negative.   Cardiovascular: Negative.   Neurological: Negative.        Objective:   Physical Exam  Constitutional: She is oriented to person, place, and time. She appears well-developed and well-nourished.  Cardiovascular: Normal rate, regular rhythm, normal heart sounds and intact distal pulses.  Pulmonary/Chest: Effort normal and breath sounds normal. No stridor. No respiratory distress. She has no wheezes. She has no rales.  Musculoskeletal: She exhibits no edema.  Neurological: She is alert and oriented to person, place, and time.          Assessment & Plan:  HTN is stable. Get fasting labs for lipids, etc.  Alysia Penna, MD

## 2018-01-10 DIAGNOSIS — Z85828 Personal history of other malignant neoplasm of skin: Secondary | ICD-10-CM | POA: Diagnosis not present

## 2018-01-10 DIAGNOSIS — L570 Actinic keratosis: Secondary | ICD-10-CM | POA: Diagnosis not present

## 2018-01-10 DIAGNOSIS — L821 Other seborrheic keratosis: Secondary | ICD-10-CM | POA: Diagnosis not present

## 2018-01-10 DIAGNOSIS — D1801 Hemangioma of skin and subcutaneous tissue: Secondary | ICD-10-CM | POA: Diagnosis not present

## 2018-03-26 DIAGNOSIS — H524 Presbyopia: Secondary | ICD-10-CM | POA: Diagnosis not present

## 2018-03-26 DIAGNOSIS — H26493 Other secondary cataract, bilateral: Secondary | ICD-10-CM | POA: Diagnosis not present

## 2018-03-26 DIAGNOSIS — H52223 Regular astigmatism, bilateral: Secondary | ICD-10-CM | POA: Diagnosis not present

## 2018-03-26 DIAGNOSIS — H35372 Puckering of macula, left eye: Secondary | ICD-10-CM | POA: Diagnosis not present

## 2018-03-26 DIAGNOSIS — H4323 Crystalline deposits in vitreous body, bilateral: Secondary | ICD-10-CM | POA: Diagnosis not present

## 2018-03-26 DIAGNOSIS — H5203 Hypermetropia, bilateral: Secondary | ICD-10-CM | POA: Diagnosis not present

## 2018-07-06 DIAGNOSIS — L819 Disorder of pigmentation, unspecified: Secondary | ICD-10-CM | POA: Diagnosis not present

## 2018-07-06 DIAGNOSIS — L570 Actinic keratosis: Secondary | ICD-10-CM | POA: Diagnosis not present

## 2018-07-16 ENCOUNTER — Encounter: Payer: Self-pay | Admitting: Family Medicine

## 2018-07-16 ENCOUNTER — Ambulatory Visit (INDEPENDENT_AMBULATORY_CARE_PROVIDER_SITE_OTHER): Payer: Medicare Other | Admitting: Family Medicine

## 2018-07-16 VITALS — BP 118/74 | HR 69 | Temp 98.2°F | Ht 65.5 in | Wt 147.4 lb

## 2018-07-16 DIAGNOSIS — E559 Vitamin D deficiency, unspecified: Secondary | ICD-10-CM

## 2018-07-16 DIAGNOSIS — G43009 Migraine without aura, not intractable, without status migrainosus: Secondary | ICD-10-CM

## 2018-07-16 DIAGNOSIS — Z23 Encounter for immunization: Secondary | ICD-10-CM | POA: Diagnosis not present

## 2018-07-16 DIAGNOSIS — M150 Primary generalized (osteo)arthritis: Secondary | ICD-10-CM | POA: Diagnosis not present

## 2018-07-16 DIAGNOSIS — M810 Age-related osteoporosis without current pathological fracture: Secondary | ICD-10-CM

## 2018-07-16 DIAGNOSIS — I10 Essential (primary) hypertension: Secondary | ICD-10-CM | POA: Diagnosis not present

## 2018-07-16 DIAGNOSIS — R739 Hyperglycemia, unspecified: Secondary | ICD-10-CM

## 2018-07-16 DIAGNOSIS — M159 Polyosteoarthritis, unspecified: Secondary | ICD-10-CM

## 2018-07-16 DIAGNOSIS — E782 Mixed hyperlipidemia: Secondary | ICD-10-CM

## 2018-07-16 LAB — POC URINALSYSI DIPSTICK (AUTOMATED)
BILIRUBIN UA: NEGATIVE
Blood, UA: NEGATIVE
GLUCOSE UA: NEGATIVE
KETONES UA: NEGATIVE
Leukocytes, UA: NEGATIVE
Nitrite, UA: NEGATIVE
Protein, UA: NEGATIVE
SPEC GRAV UA: 1.01 (ref 1.010–1.025)
Urobilinogen, UA: 0.2 E.U./dL
pH, UA: 8.5 — AB (ref 5.0–8.0)

## 2018-07-16 LAB — BASIC METABOLIC PANEL
BUN: 16 mg/dL (ref 6–23)
CALCIUM: 10.5 mg/dL (ref 8.4–10.5)
CHLORIDE: 100 meq/L (ref 96–112)
CO2: 29 meq/L (ref 19–32)
Creatinine, Ser: 0.76 mg/dL (ref 0.40–1.20)
GFR: 79.24 mL/min (ref >60.00)
GLUCOSE: 102 mg/dL — AB (ref 70–99)
POTASSIUM: 4.6 meq/L (ref 3.5–5.1)
Sodium: 138 mEq/L (ref 135–145)

## 2018-07-16 LAB — CBC WITH DIFFERENTIAL/PLATELET
BASOS PCT: 1.2 % (ref 0.0–3.0)
Basophils Absolute: 0.1 10*3/uL (ref 0.0–0.1)
EOS ABS: 0.1 10*3/uL (ref 0.0–0.7)
EOS PCT: 1.9 % (ref 0.0–5.0)
HCT: 40 % (ref 36.0–46.0)
Hemoglobin: 13.6 g/dL (ref 12.0–15.0)
LYMPHS ABS: 2.8 10*3/uL (ref 0.7–4.0)
Lymphocytes Relative: 40.5 % (ref 12.0–46.0)
MCHC: 34.1 g/dL (ref 30.0–36.0)
MCV: 90.5 fl (ref 78.0–100.0)
MONO ABS: 0.5 10*3/uL (ref 0.1–1.0)
Monocytes Relative: 7.4 % (ref 3.0–12.0)
NEUTROS ABS: 3.4 10*3/uL (ref 1.4–7.7)
Neutrophils Relative %: 49 % (ref 43.0–77.0)
PLATELETS: 393 10*3/uL (ref 150.0–400.0)
RBC: 4.42 Mil/uL (ref 3.87–5.11)
RDW: 12.7 % (ref 11.5–15.5)
WBC: 6.9 10*3/uL (ref 4.0–10.5)

## 2018-07-16 LAB — LIPID PANEL
CHOLESTEROL: 139 mg/dL (ref 0–200)
HDL: 50.3 mg/dL (ref >39.00)
LDL Cholesterol: 71 mg/dL (ref 0–99)
NonHDL: 88.25
Total CHOL/HDL Ratio: 3
Triglycerides: 86 mg/dL (ref 0.0–149.0)
VLDL: 17.2 mg/dL (ref 0.0–40.0)

## 2018-07-16 LAB — TSH: TSH: 1.15 u[IU]/mL (ref 0.35–4.50)

## 2018-07-16 LAB — VITAMIN D 25 HYDROXY (VIT D DEFICIENCY, FRACTURES): VITD: 53.72 ng/mL (ref 30.00–100.00)

## 2018-07-16 LAB — HEPATIC FUNCTION PANEL
ALBUMIN: 4.6 g/dL (ref 3.5–5.2)
ALK PHOS: 48 U/L (ref 39–117)
ALT: 21 U/L (ref 0–35)
AST: 20 U/L (ref 0–37)
Bilirubin, Direct: 0.1 mg/dL (ref 0.0–0.3)
TOTAL PROTEIN: 6.9 g/dL (ref 6.0–8.3)
Total Bilirubin: 0.4 mg/dL (ref 0.2–1.2)

## 2018-07-16 LAB — HEMOGLOBIN A1C: HEMOGLOBIN A1C: 5.6 % (ref 4.6–6.5)

## 2018-07-16 MED ORDER — FENOFIBRATE 160 MG PO TABS: 160.0000 mg | ORAL_TABLET | Freq: Every day | ORAL | 3 refills | Status: DC

## 2018-07-16 MED ORDER — SIMVASTATIN 40 MG PO TABS: 40.0000 mg | ORAL_TABLET | Freq: Every day | ORAL | 3 refills | Status: DC

## 2018-07-16 MED ORDER — TRIAMTERENE-HCTZ 37.5-25 MG PO TABS: 1.0000 | ORAL_TABLET | Freq: Every day | ORAL | 3 refills | Status: DC

## 2018-07-16 MED ORDER — AMLODIPINE BESYLATE 5 MG PO TABS: 5.0000 mg | ORAL_TABLET | Freq: Every day | ORAL | 3 refills | Status: DC

## 2018-07-16 MED ORDER — SUMATRIPTAN SUCCINATE 100 MG PO TABS: 100.0000 mg | ORAL_TABLET | Freq: Once | ORAL | 3 refills | Status: DC | PRN

## 2018-07-16 NOTE — Progress Notes (Signed)
   Subjective:    Patient ID: Rachael Mitchell, female    DOB: 23-Feb-1946, 72 y.o.   MRN: [id number]  HPI Here to follow up on issues. She feels great. Her BP is stable. Her migraines are stable.    Review of Systems  Constitutional: Negative.   HENT: Negative.   Eyes: Negative.   Respiratory: Negative.   Cardiovascular: Negative.   Gastrointestinal: Negative.   Genitourinary: Negative for decreased urine volume, difficulty urinating, dyspareunia, dysuria, enuresis, flank pain, frequency, hematuria, pelvic pain and urgency.  Musculoskeletal: Negative.   Skin: Negative.   Neurological: Negative.   Psychiatric/Behavioral: Negative.        Objective:   Physical Exam  Constitutional: She appears well-developed and well-nourished. No distress.  HENT:  Head: Normocephalic and atraumatic.  Right Ear: External ear normal.  Left Ear: External ear normal.  Nose: Nose normal.  Mouth/Throat: Oropharynx is clear and moist. No oropharyngeal exudate.  Eyes: Pupils are equal, round, and reactive to light. Conjunctivae and EOM are normal. Right eye exhibits no discharge. Left eye exhibits no discharge. No scleral icterus.  Neck: Normal range of motion. Neck supple. No JVD present. No thyromegaly present.  Cardiovascular: Normal rate, regular rhythm, normal heart sounds and intact distal pulses. Exam reveals no gallop and no friction rub.  No murmur heard. Pulmonary/Chest: Effort normal and breath sounds normal. No stridor. No respiratory distress. She has no wheezes. She has no rales. She exhibits no tenderness. No breast tenderness, discharge or bleeding.  Abdominal: Soft. Normal appearance and bowel sounds are normal. She exhibits no distension, no abdominal bruit, no ascites and no mass. There is no hepatosplenomegaly. There is no tenderness. There is no rigidity, no rebound and no guarding. No hernia.  Genitourinary: Rectum normal. No breast tenderness, discharge or bleeding. Cervix exhibits no  motion tenderness, no discharge and no friability. Right adnexum displays no mass, no tenderness and no fullness. Left adnexum displays no mass, no tenderness and no fullness. No erythema, tenderness or bleeding in the vagina.  Musculoskeletal: Normal range of motion. She exhibits no edema or tenderness.  Lymphadenopathy:    She has no cervical adenopathy.  Neurological: She is alert. She has normal reflexes. No cranial nerve deficit. She exhibits normal muscle tone. Coordination normal.  Skin: Skin is warm and dry. No rash noted. She is not diaphoretic. No erythema. No pallor.  Psychiatric: She has a normal mood and affect. Her behavior is normal. Judgment and thought content normal.          Assessment & Plan:  She is doing well. Her medications have been refilled. Get fasting labs. Set up a bine density test.  Alysia Penna, MD

## 2018-07-18 ENCOUNTER — Encounter: Payer: Self-pay | Admitting: *Deleted

## 2018-07-27 ENCOUNTER — Ambulatory Visit (INDEPENDENT_AMBULATORY_CARE_PROVIDER_SITE_OTHER)
Admission: RE | Admit: 2018-07-27 | Discharge: 2018-07-27 | Disposition: A | Discharge status: Home / Self Care | Payer: Medicare Other | Source: Ambulatory Visit | Attending: Family Medicine | Admitting: Family Medicine

## 2018-07-27 DIAGNOSIS — M810 Age-related osteoporosis without current pathological fracture: Secondary | ICD-10-CM

## 2018-08-03 ENCOUNTER — Encounter: Payer: Self-pay | Admitting: *Deleted

## 2018-08-10 ENCOUNTER — Other Ambulatory Visit (INDEPENDENT_AMBULATORY_CARE_PROVIDER_SITE_OTHER): Payer: Medicare Other

## 2018-08-10 ENCOUNTER — Ambulatory Visit (INDEPENDENT_AMBULATORY_CARE_PROVIDER_SITE_OTHER): Payer: Medicare Other | Admitting: Internal Medicine

## 2018-08-10 ENCOUNTER — Encounter: Payer: Self-pay | Admitting: Internal Medicine

## 2018-08-10 VITALS — BP 130/70 | HR 85 | Temp 98.1°F | Ht 65.5 in | Wt 150.8 lb

## 2018-08-10 DIAGNOSIS — R0602 Shortness of breath: Secondary | ICD-10-CM

## 2018-08-10 DIAGNOSIS — R2242 Localized swelling, mass and lump, left lower limb: Secondary | ICD-10-CM

## 2018-08-10 LAB — COMPREHENSIVE METABOLIC PANEL WITH GFR
ALT: 19 U/L (ref 0–35)
AST: 19 U/L (ref 0–37)
Albumin: 4.5 g/dL (ref 3.5–5.2)
Alkaline Phosphatase: 57 U/L (ref 39–117)
BUN: 19 mg/dL (ref 6–23)
CO2: 29 meq/L (ref 19–32)
Calcium: 10.3 mg/dL (ref 8.4–10.5)
Chloride: 103 meq/L (ref 96–112)
Creatinine, Ser: 0.75 mg/dL (ref 0.40–1.20)
GFR: 80.44 mL/min
Glucose, Bld: 109 mg/dL — ABNORMAL HIGH (ref 70–99)
Potassium: 4.3 meq/L (ref 3.5–5.1)
Sodium: 140 meq/L (ref 135–145)
Total Bilirubin: 0.3 mg/dL (ref 0.2–1.2)
Total Protein: 7.4 g/dL (ref 6.0–8.3)

## 2018-08-10 LAB — BRAIN NATRIURETIC PEPTIDE: Pro B Natriuretic peptide (BNP): 37 pg/mL (ref 0.0–100.0)

## 2018-08-10 NOTE — Patient Instructions (Addendum)
We will have you double the triamterene/hctz for 3-4 days to help the swelling.   We will check the labs today.   If the swelling returns then the amlodipine could be the cause of the swelling.

## 2018-08-10 NOTE — Assessment & Plan Note (Signed)
Suspect from old fracture mixed with the trauma. Will have her double triamterene/hctz for 3 days. Checking CMP and BNP to rule out additional cause. If no cause could be related to amlodipine usage.

## 2018-08-10 NOTE — Progress Notes (Addendum)
   Subjective:    Patient ID: Rachael Mitchell, female    DOB: 1945-09-01, 72 y.o.   MRN: [id number]  HPI The patient is a 72 YO female coming in for left foot swelling. Started about 2 weeks ago. She was stepping down off a step and missed a step. Did not think she hurt that ankle. She has broken that ankle 2-3 times over the years. Overall it is stable. Denies pain in the foot except sometimes with walking. Has tried tylenol once for pain which helped. Swelling is worse after walking or in the evening. In the morning there is trace left in the left foot. Right foot swells minimally with lots of walking but is down by morning. Is taking amlodipine 5 mg daily and this is not a new medicine (started about 1 year ago).   Review of Systems  Constitutional: Negative.   HENT: Negative.   Eyes: Negative.   Respiratory: Negative for cough, chest tightness and shortness of breath.   Cardiovascular: Positive for leg swelling. Negative for chest pain and palpitations.  Gastrointestinal: Negative for abdominal distention, abdominal pain, constipation, diarrhea, nausea and vomiting.  Musculoskeletal: Negative.   Skin: Negative.   Neurological: Negative.   Psychiatric/Behavioral: Negative.       Objective:   Physical Exam Constitutional:      Appearance: She is well-developed.  HENT:     Head: Normocephalic and atraumatic.  Neck:     Musculoskeletal: Normal range of motion.  Cardiovascular:     Rate and Rhythm: Normal rate and regular rhythm.  Pulmonary:     Effort: Pulmonary effort is normal. No respiratory distress.     Breath sounds: Normal breath sounds. No wheezing or rales.  Abdominal:     General: Bowel sounds are normal. There is no distension.     Palpations: Abdomen is soft.     Tenderness: There is no abdominal tenderness. There is no rebound.  Musculoskeletal:     Comments: Left foot swelling in the midfoot and ankle. No pain to palpation. Minimal tenderness along the midfoot bony  prominence.   Skin:    General: Skin is warm and dry.  Neurological:     Mental Status: She is alert and oriented to person, place, and time.     Coordination: Coordination normal.    Vitals:   08/10/18 1108  BP: 130/70  Pulse: 85  Temp: 98.1 F (36.7 C)  TempSrc: Oral  SpO2: 97%  Weight: 150 lb 12 oz (68.4 kg)  Height: 5' 5.5" (1.664 m)      Assessment & Plan:

## 2018-09-10 ENCOUNTER — Other Ambulatory Visit: Payer: Self-pay | Admitting: Family Medicine

## 2018-09-10 DIAGNOSIS — Z1231 Encounter for screening mammogram for malignant neoplasm of breast: Secondary | ICD-10-CM

## 2018-09-26 DIAGNOSIS — L309 Dermatitis, unspecified: Secondary | ICD-10-CM | POA: Diagnosis not present

## 2018-10-01 DIAGNOSIS — L233 Allergic contact dermatitis due to drugs in contact with skin: Secondary | ICD-10-CM | POA: Diagnosis not present

## 2018-10-05 DIAGNOSIS — L233 Allergic contact dermatitis due to drugs in contact with skin: Secondary | ICD-10-CM | POA: Diagnosis not present

## 2018-10-15 ENCOUNTER — Ambulatory Visit
Admission: RE | Admit: 2018-10-15 | Discharge: 2018-10-15 | Disposition: A | Discharge status: Home / Self Care | Payer: Medicare Other | Source: Ambulatory Visit | Attending: Family Medicine | Admitting: Family Medicine

## 2018-10-15 DIAGNOSIS — Z1231 Encounter for screening mammogram for malignant neoplasm of breast: Secondary | ICD-10-CM | POA: Diagnosis not present

## 2019-01-15 DIAGNOSIS — D229 Melanocytic nevi, unspecified: Secondary | ICD-10-CM | POA: Diagnosis not present

## 2019-01-15 DIAGNOSIS — D235 Other benign neoplasm of skin of trunk: Secondary | ICD-10-CM | POA: Diagnosis not present

## 2019-01-15 DIAGNOSIS — D1801 Hemangioma of skin and subcutaneous tissue: Secondary | ICD-10-CM | POA: Diagnosis not present

## 2019-01-15 DIAGNOSIS — L821 Other seborrheic keratosis: Secondary | ICD-10-CM | POA: Diagnosis not present

## 2019-01-15 DIAGNOSIS — L819 Disorder of pigmentation, unspecified: Secondary | ICD-10-CM | POA: Diagnosis not present

## 2019-01-15 DIAGNOSIS — D485 Neoplasm of uncertain behavior of skin: Secondary | ICD-10-CM | POA: Diagnosis not present

## 2019-01-15 DIAGNOSIS — L309 Dermatitis, unspecified: Secondary | ICD-10-CM | POA: Diagnosis not present

## 2019-01-15 DIAGNOSIS — L814 Other melanin hyperpigmentation: Secondary | ICD-10-CM | POA: Diagnosis not present

## 2019-01-16 ENCOUNTER — Encounter: Payer: Self-pay | Admitting: Family Medicine

## 2019-01-16 ENCOUNTER — Ambulatory Visit (INDEPENDENT_AMBULATORY_CARE_PROVIDER_SITE_OTHER): Payer: Medicare Other | Admitting: Family Medicine

## 2019-01-16 ENCOUNTER — Other Ambulatory Visit: Payer: Self-pay

## 2019-01-16 VITALS — BP 140/66 | HR 78 | Temp 98.4°F | Wt 147.0 lb

## 2019-01-16 DIAGNOSIS — R0789 Other chest pain: Secondary | ICD-10-CM | POA: Diagnosis not present

## 2019-01-16 DIAGNOSIS — R739 Hyperglycemia, unspecified: Secondary | ICD-10-CM

## 2019-01-16 DIAGNOSIS — I10 Essential (primary) hypertension: Secondary | ICD-10-CM | POA: Diagnosis not present

## 2019-01-16 LAB — BASIC METABOLIC PANEL
BUN: 16 mg/dL (ref 6–23)
CO2: 27 mEq/L (ref 19–32)
Calcium: 10.1 mg/dL (ref 8.4–10.5)
Chloride: 100 mEq/L (ref 96–112)
Creatinine, Ser: 0.71 mg/dL (ref 0.40–1.20)
GFR: 80.53 mL/min (ref >60.00)
Glucose, Bld: 92 mg/dL (ref 70–99)
Potassium: 4.6 mEq/L (ref 3.5–5.1)
Sodium: 137 mEq/L (ref 135–145)

## 2019-01-16 LAB — HEMOGLOBIN A1C: Hgb A1c MFr Bld: 5.7 % (ref 4.6–6.5)

## 2019-01-16 LAB — TSH: TSH: 1.31 u[IU]/mL (ref 0.35–4.50)

## 2019-01-16 NOTE — Progress Notes (Signed)
   Subjective:    Patient ID: Rachael Mitchell, female    DOB: March 10, 1946, 73 y.o.   MRN: [id number]  HPI Here for several issues. First about 3 weeks ago she developed an intermittent sharp but mild pain in the right upper chest area that runs from the armpit down to the right lateral breast. No recent trauma, no skin color changes, no nipple DC. She has not been able to feel a lump per se. She had a normal 3D mammogram on 10-15-18. The pain has been gradually improving. Also she notes that her BP has been creeping up at home. The systolic is often in the 389H or 160s, while the diastolic remains stable in the 70s or 80s. No other symptoms.    Review of Systems  Constitutional: Negative.   Respiratory: Negative.   Cardiovascular: Positive for chest pain. Negative for palpitations and leg swelling.  Neurological: Negative.        Objective:   Physical Exam Constitutional:      General: She is not in acute distress.    Appearance: Normal appearance.  Cardiovascular:     Rate and Rhythm: Normal rate and regular rhythm.     Pulses: Normal pulses.     Heart sounds: Normal heart sounds.  Pulmonary:     Effort: Pulmonary effort is normal. No respiratory distress.     Breath sounds: Normal breath sounds. No stridor. No wheezing, rhonchi or rales.     Comments: She is tender along the chest wall of the right lateral upper chest just anterior to and inferior to the axilla  Genitourinary:    Comments: The right breast and axilla are normal with no lumps found and no tenderness  Musculoskeletal:     Right lower leg: No edema.     Left lower leg: No edema.  Lymphadenopathy:     Cervical: No cervical adenopathy.  Neurological:     General: No focal deficit present.     Mental Status: She is alert and oriented to person, place, and time.           Assessment & Plan:  For the elevated BP we will check some labs today. We will increase the Amlodipine to 10 mg daily and she will recheck in  3-4 weeks. The right sided chest pain is musculoskeletal and it seems to be resolving. The breast seems to be uninvolved. Recheck prn.  Alysia Penna, MD

## 2019-01-17 ENCOUNTER — Encounter: Payer: Self-pay | Admitting: *Deleted

## 2019-07-04 ENCOUNTER — Other Ambulatory Visit: Payer: Self-pay | Admitting: Family Medicine

## 2019-07-22 ENCOUNTER — Ambulatory Visit: Payer: Medicare Other | Admitting: Family Medicine

## 2019-07-22 ENCOUNTER — Other Ambulatory Visit: Payer: Self-pay

## 2019-07-22 ENCOUNTER — Encounter: Payer: Self-pay | Admitting: Family Medicine

## 2019-07-22 NOTE — Patient Instructions (Signed)
Health Maintenance Due  Topic Date Due  . INFLUENZA VACCINE  03/23/2019    Depression screen [facility] 2/9 07/16/2018 07/10/2017 12/17/2014  Decreased Interest 0 0 0  Down, Depressed, Hopeless 0 0 0  PHQ - 2 Score 0 0 0

## 2019-07-29 ENCOUNTER — Other Ambulatory Visit: Payer: Self-pay

## 2019-07-30 ENCOUNTER — Ambulatory Visit (INDEPENDENT_AMBULATORY_CARE_PROVIDER_SITE_OTHER): Payer: Medicare Other | Admitting: Family Medicine

## 2019-07-30 ENCOUNTER — Encounter: Payer: Self-pay | Admitting: Family Medicine

## 2019-07-30 VITALS — BP 126/60 | HR 80 | Temp 98.2°F | Ht 65.5 in | Wt 148.0 lb

## 2019-07-30 DIAGNOSIS — M8949 Other hypertrophic osteoarthropathy, multiple sites: Secondary | ICD-10-CM

## 2019-07-30 DIAGNOSIS — G43009 Migraine without aura, not intractable, without status migrainosus: Secondary | ICD-10-CM

## 2019-07-30 DIAGNOSIS — E782 Mixed hyperlipidemia: Secondary | ICD-10-CM | POA: Diagnosis not present

## 2019-07-30 DIAGNOSIS — E559 Vitamin D deficiency, unspecified: Secondary | ICD-10-CM

## 2019-07-30 DIAGNOSIS — R739 Hyperglycemia, unspecified: Secondary | ICD-10-CM | POA: Diagnosis not present

## 2019-07-30 DIAGNOSIS — M159 Polyosteoarthritis, unspecified: Secondary | ICD-10-CM

## 2019-07-30 DIAGNOSIS — I10 Essential (primary) hypertension: Secondary | ICD-10-CM | POA: Diagnosis not present

## 2019-07-30 LAB — CBC WITH DIFFERENTIAL/PLATELET
Basophils Absolute: 0.1 10*3/uL (ref 0.0–0.1)
Basophils Relative: 1 % (ref 0.0–3.0)
Eosinophils Absolute: 0.1 10*3/uL (ref 0.0–0.7)
Eosinophils Relative: 1.7 % (ref 0.0–5.0)
HCT: 39.7 % (ref 36.0–46.0)
Hemoglobin: 13.7 g/dL (ref 12.0–15.0)
Lymphocytes Relative: 42.7 % (ref 12.0–46.0)
Lymphs Abs: 3.4 10*3/uL (ref 0.7–4.0)
MCHC: 34.6 g/dL (ref 30.0–36.0)
MCV: 90.2 fl (ref 78.0–100.0)
Monocytes Absolute: 0.6 10*3/uL (ref 0.1–1.0)
Monocytes Relative: 7.4 % (ref 3.0–12.0)
Neutro Abs: 3.8 10*3/uL (ref 1.4–7.7)
Neutrophils Relative %: 47.2 % (ref 43.0–77.0)
Platelets: 421 10*3/uL — ABNORMAL HIGH (ref 150.0–400.0)
RBC: 4.4 Mil/uL (ref 3.87–5.11)
RDW: 12.2 % (ref 11.5–15.5)
WBC: 8 10*3/uL (ref 4.0–10.5)

## 2019-07-30 LAB — TSH: TSH: 1.26 u[IU]/mL (ref 0.35–4.50)

## 2019-07-30 LAB — LIPID PANEL
Cholesterol: 139 mg/dL (ref 0–200)
HDL: 50.7 mg/dL (ref >39.00)
LDL Cholesterol: 68 mg/dL (ref 0–99)
NonHDL: 88.56
Total CHOL/HDL Ratio: 3
Triglycerides: 103 mg/dL (ref 0.0–149.0)
VLDL: 20.6 mg/dL (ref 0.0–40.0)

## 2019-07-30 LAB — HEPATIC FUNCTION PANEL
ALT: 25 U/L (ref 0–35)
AST: 23 U/L (ref 0–37)
Albumin: 4.5 g/dL (ref 3.5–5.2)
Alkaline Phosphatase: 55 U/L (ref 39–117)
Bilirubin, Direct: 0.1 mg/dL (ref 0.0–0.3)
Total Bilirubin: 0.5 mg/dL (ref 0.2–1.2)
Total Protein: 6.8 g/dL (ref 6.0–8.3)

## 2019-07-30 LAB — BASIC METABOLIC PANEL
BUN: 16 mg/dL (ref 6–23)
CO2: 29 mEq/L (ref 19–32)
Calcium: 10.1 mg/dL (ref 8.4–10.5)
Chloride: 102 mEq/L (ref 96–112)
Creatinine, Ser: 0.81 mg/dL (ref 0.40–1.20)
GFR: 69.07 mL/min (ref >60.00)
Glucose, Bld: 108 mg/dL — ABNORMAL HIGH (ref 70–99)
Potassium: 3.9 mEq/L (ref 3.5–5.1)
Sodium: 138 mEq/L (ref 135–145)

## 2019-07-30 LAB — URINALYSIS
Bilirubin Urine: NEGATIVE
Hgb urine dipstick: NEGATIVE
Ketones, ur: NEGATIVE
Leukocytes,Ua: NEGATIVE
Nitrite: NEGATIVE
Specific Gravity, Urine: 1.015 (ref 1.000–1.030)
Total Protein, Urine: NEGATIVE
Urine Glucose: NEGATIVE
Urobilinogen, UA: 0.2 (ref 0.0–1.0)
pH: 7.5 (ref 5.0–8.0)

## 2019-07-30 LAB — VITAMIN D 25 HYDROXY (VIT D DEFICIENCY, FRACTURES): VITD: 59.34 ng/mL (ref 30.00–100.00)

## 2019-07-30 LAB — HEMOGLOBIN A1C: Hgb A1c MFr Bld: 5.6 % (ref 4.6–6.5)

## 2019-07-30 MED ORDER — DICLOFENAC SODIUM 1 % EX GEL: 2.0000 g | Freq: Four times a day (QID) | CUTANEOUS | 5 refills | Status: DC

## 2019-07-30 MED ORDER — SIMVASTATIN 40 MG PO TABS: 40.0000 mg | ORAL_TABLET | Freq: Every day | ORAL | 3 refills | Status: DC

## 2019-07-30 MED ORDER — SUMATRIPTAN SUCCINATE 100 MG PO TABS: 100.0000 mg | ORAL_TABLET | Freq: Once | ORAL | 3 refills | Status: DC | PRN

## 2019-07-30 MED ORDER — TRIAMTERENE-HCTZ 37.5-25 MG PO TABS: 1.0000 | ORAL_TABLET | Freq: Every day | ORAL | 3 refills | Status: DC

## 2019-07-30 NOTE — Progress Notes (Signed)
   Subjective:    Patient ID: Rachael Mitchell, female    DOB: 1946/01/29, 73 y.o.   MRN: [id number]  HPI Here to follow up on issues.  She feels well. Her BP is stable. She has some pain and stiffness in her hands which Diclofenac gel helps. Her migraines are stable. She plans to have her hearing checked again soon.    Review of Systems  Constitutional: Negative.   HENT: Negative.   Eyes: Negative.   Respiratory: Negative.   Cardiovascular: Negative.   Gastrointestinal: Negative.   Genitourinary: Negative for decreased urine volume, difficulty urinating, dyspareunia, dysuria, enuresis, flank pain, frequency, hematuria, pelvic pain and urgency.  Musculoskeletal: Negative.   Skin: Negative.   Neurological: Negative.   Psychiatric/Behavioral: Negative.        Objective:   Physical Exam Constitutional:      General: She is not in acute distress.    Appearance: She is well-developed.  HENT:     Head: Normocephalic and atraumatic.     Right Ear: External ear normal.     Left Ear: External ear normal.     Nose: Nose normal.     Mouth/Throat:     Pharynx: No oropharyngeal exudate.  Eyes:     General: No scleral icterus.    Conjunctiva/sclera: Conjunctivae normal.     Pupils: Pupils are equal, round, and reactive to light.  Neck:     Musculoskeletal: Normal range of motion and neck supple.     Thyroid: No thyromegaly.     Vascular: No JVD.  Cardiovascular:     Rate and Rhythm: Normal rate and regular rhythm.     Heart sounds: Normal heart sounds. No murmur. No friction rub. No gallop.   Pulmonary:     Effort: Pulmonary effort is normal. No respiratory distress.     Breath sounds: Normal breath sounds. No wheezing or rales.  Chest:     Chest wall: No tenderness.  Abdominal:     General: Bowel sounds are normal. There is no distension.     Palpations: Abdomen is soft. There is no mass.     Tenderness: There is no abdominal tenderness. There is no guarding or rebound.   Genitourinary:    Comments: Breasts are normal with no tenderness and no lumps  Musculoskeletal: Normal range of motion.        General: No tenderness.  Lymphadenopathy:     Cervical: No cervical adenopathy.  Skin:    General: Skin is warm and dry.     Findings: No erythema or rash.  Neurological:     Mental Status: She is alert and oriented to person, place, and time.     Cranial Nerves: No cranial nerve deficit.     Motor: No abnormal muscle tone.     Coordination: Coordination normal.     Deep Tendon Reflexes: Reflexes are normal and symmetric. Reflexes normal.  Psychiatric:        Behavior: Behavior normal.        Thought Content: Thought content normal.        Judgment: Judgment normal.           Assessment & Plan:  She is doing well with HTN and migraines. We will get fasting labs to check lipids, A1c, etc.  Alysia Penna, MD

## 2019-07-30 NOTE — Patient Instructions (Signed)
Health Maintenance Due  Topic Date Due  . INFLUENZA VACCINE  03/23/2019    Depression screen [facility] 2/9 07/16/2018 07/10/2017 12/17/2014  Decreased Interest 0 0 0  Down, Depressed, Hopeless 0 0 0  PHQ - 2 Score 0 0 0

## 2019-09-20 ENCOUNTER — Ambulatory Visit: Payer: Medicare Other

## 2019-09-27 ENCOUNTER — Other Ambulatory Visit: Payer: Self-pay | Admitting: Family Medicine

## 2019-09-27 ENCOUNTER — Ambulatory Visit: Payer: Medicare Other | Attending: Internal Medicine

## 2019-09-27 DIAGNOSIS — Z23 Encounter for immunization: Secondary | ICD-10-CM

## 2019-09-27 DIAGNOSIS — Z1231 Encounter for screening mammogram for malignant neoplasm of breast: Secondary | ICD-10-CM

## 2019-09-27 NOTE — Progress Notes (Signed)
   Covid-19 Vaccination Clinic  Name:  Rachael Mitchell    MRN: [id number] DOB: 06/15/1946  09/27/2019  [name] was observed post Covid-19 immunization for 15 minutes without incidence. She was provided with Vaccine Information Sheet and instruction to access the V-Safe system.   [name] was instructed to call 911 with any severe reactions post vaccine: Marland Kitchen Difficulty breathing  . Swelling of your face and throat  . A fast heartbeat  . A bad rash all over your body  . Dizziness and weakness    Immunizations Administered    Name Date Dose VIS Date Route   Pfizer COVID-19 Vaccine 09/27/2019  8:44 AM 0.3 mL 08/02/2019 Intramuscular   Manufacturer: [place]   Lot: [id number]   [place]: [id number]

## 2019-09-30 ENCOUNTER — Other Ambulatory Visit: Payer: Self-pay | Admitting: Family Medicine

## 2019-09-30 NOTE — Telephone Encounter (Signed)
Last filled 07/16/2018 Last OV 07/30/2019  Should the patient continue this medication?

## 2019-10-07 ENCOUNTER — Ambulatory Visit: Payer: Medicare Other

## 2019-10-16 DIAGNOSIS — Z961 Presence of intraocular lens: Secondary | ICD-10-CM | POA: Diagnosis not present

## 2019-10-16 DIAGNOSIS — H35372 Puckering of macula, left eye: Secondary | ICD-10-CM | POA: Diagnosis not present

## 2019-10-23 ENCOUNTER — Ambulatory Visit: Payer: Medicare Other | Attending: Internal Medicine

## 2019-10-23 DIAGNOSIS — Z23 Encounter for immunization: Secondary | ICD-10-CM | POA: Insufficient documentation

## 2019-10-23 NOTE — Progress Notes (Signed)
   Covid-19 Vaccination Clinic  Name:  Rachael Mitchell    MRN: [id number] DOB: [birth year]  10/23/2019  Rachael Mitchell was observed post Covid-19 immunization for 15 minutes without incident. She was provided with Vaccine Information Sheet and instruction to access the V-Safe system.   Rachael Mitchell was instructed to call 911 with any severe reactions post vaccine: Marland Kitchen Difficulty breathing  . Swelling of face and throat  . A fast heartbeat  . A bad rash all over body  . Dizziness and weakness   Immunizations Administered    Name Date Dose VIS Date Route   Pfizer COVID-19 Vaccine 10/23/2019  8:44 AM 0.3 mL 08/02/2019 Intramuscular   Manufacturer: [place]   Lot: [id number]   [place]: [id number]

## 2019-11-04 ENCOUNTER — Ambulatory Visit
Admission: RE | Admit: 2019-11-04 | Discharge: 2019-11-04 | Disposition: A | Discharge status: Home / Self Care | Payer: Medicare Other | Source: Ambulatory Visit | Attending: Family Medicine | Admitting: Family Medicine

## 2019-11-04 ENCOUNTER — Other Ambulatory Visit: Payer: Self-pay

## 2019-11-04 DIAGNOSIS — Z1231 Encounter for screening mammogram for malignant neoplasm of breast: Secondary | ICD-10-CM

## 2019-11-06 ENCOUNTER — Other Ambulatory Visit: Payer: Self-pay | Admitting: Family Medicine

## 2019-11-06 DIAGNOSIS — R928 Other abnormal and inconclusive findings on diagnostic imaging of breast: Secondary | ICD-10-CM

## 2019-11-14 ENCOUNTER — Ambulatory Visit
Admission: RE | Admit: 2019-11-14 | Discharge: 2019-11-14 | Disposition: A | Discharge status: Home / Self Care | Payer: Medicare Other | Source: Ambulatory Visit | Attending: Family Medicine | Admitting: Family Medicine

## 2019-11-14 ENCOUNTER — Other Ambulatory Visit: Payer: Self-pay

## 2019-11-14 DIAGNOSIS — N6041 Mammary duct ectasia of right breast: Secondary | ICD-10-CM | POA: Diagnosis not present

## 2019-11-14 DIAGNOSIS — R928 Other abnormal and inconclusive findings on diagnostic imaging of breast: Secondary | ICD-10-CM

## 2019-11-22 ENCOUNTER — Other Ambulatory Visit: Payer: Medicare Other

## 2020-01-16 DIAGNOSIS — I8393 Asymptomatic varicose veins of bilateral lower extremities: Secondary | ICD-10-CM | POA: Diagnosis not present

## 2020-01-16 DIAGNOSIS — L728 Other follicular cysts of the skin and subcutaneous tissue: Secondary | ICD-10-CM | POA: Diagnosis not present

## 2020-01-16 DIAGNOSIS — L814 Other melanin hyperpigmentation: Secondary | ICD-10-CM | POA: Diagnosis not present

## 2020-01-16 DIAGNOSIS — L309 Dermatitis, unspecified: Secondary | ICD-10-CM | POA: Diagnosis not present

## 2020-01-16 DIAGNOSIS — L821 Other seborrheic keratosis: Secondary | ICD-10-CM | POA: Diagnosis not present

## 2020-01-16 DIAGNOSIS — L819 Disorder of pigmentation, unspecified: Secondary | ICD-10-CM | POA: Diagnosis not present

## 2020-01-16 DIAGNOSIS — D1801 Hemangioma of skin and subcutaneous tissue: Secondary | ICD-10-CM | POA: Diagnosis not present

## 2020-01-16 DIAGNOSIS — D229 Melanocytic nevi, unspecified: Secondary | ICD-10-CM | POA: Diagnosis not present

## 2020-01-28 ENCOUNTER — Other Ambulatory Visit: Payer: Self-pay

## 2020-01-28 ENCOUNTER — Encounter: Payer: Self-pay | Admitting: Family Medicine

## 2020-01-28 ENCOUNTER — Ambulatory Visit (INDEPENDENT_AMBULATORY_CARE_PROVIDER_SITE_OTHER): Payer: Medicare Other | Admitting: Family Medicine

## 2020-01-28 VITALS — BP 120/60 | HR 74 | Temp 97.9°F | Wt 150.2 lb

## 2020-01-28 DIAGNOSIS — I10 Essential (primary) hypertension: Secondary | ICD-10-CM | POA: Diagnosis not present

## 2020-01-28 NOTE — Progress Notes (Signed)
   Subjective:    Patient ID: Rachael Mitchell, female    DOB: 06/17/1946, 74 y.o.   MRN: [id number]  HPI Here to follow up on BP. She feels great and her BP is stable. She is working out at [employer] again and swimming.    Review of Systems  Constitutional: Negative.   Respiratory: Negative.   Cardiovascular: Negative.        Objective:   Physical Exam Constitutional:      Appearance: Normal appearance.  Cardiovascular:     Rate and Rhythm: Normal rate and regular rhythm.     Pulses: Normal pulses.     Heart sounds: Normal heart sounds.  Pulmonary:     Effort: Pulmonary effort is normal.     Breath sounds: Normal breath sounds.  Neurological:     Mental Status: She is alert.           Assessment & Plan:  HTN is stable. Recheck at her well exam in December. Alysia Penna, MD

## 2020-05-25 DIAGNOSIS — Z23 Encounter for immunization: Secondary | ICD-10-CM | POA: Diagnosis not present

## 2020-06-22 DIAGNOSIS — Z23 Encounter for immunization: Secondary | ICD-10-CM | POA: Diagnosis not present

## 2020-06-30 ENCOUNTER — Other Ambulatory Visit: Payer: Self-pay | Admitting: Family Medicine

## 2020-07-30 ENCOUNTER — Encounter: Payer: Self-pay | Admitting: Family Medicine

## 2020-07-30 ENCOUNTER — Ambulatory Visit (INDEPENDENT_AMBULATORY_CARE_PROVIDER_SITE_OTHER): Payer: Medicare Other | Admitting: Family Medicine

## 2020-07-30 ENCOUNTER — Other Ambulatory Visit: Payer: Self-pay

## 2020-07-30 VITALS — BP 146/74 | HR 69 | Temp 97.9°F | Ht 65.5 in | Wt 153.0 lb

## 2020-07-30 DIAGNOSIS — I10 Essential (primary) hypertension: Secondary | ICD-10-CM

## 2020-07-30 DIAGNOSIS — G43009 Migraine without aura, not intractable, without status migrainosus: Secondary | ICD-10-CM | POA: Diagnosis not present

## 2020-07-30 DIAGNOSIS — E559 Vitamin D deficiency, unspecified: Secondary | ICD-10-CM

## 2020-07-30 DIAGNOSIS — M8949 Other hypertrophic osteoarthropathy, multiple sites: Secondary | ICD-10-CM

## 2020-07-30 DIAGNOSIS — E782 Mixed hyperlipidemia: Secondary | ICD-10-CM

## 2020-07-30 DIAGNOSIS — R739 Hyperglycemia, unspecified: Secondary | ICD-10-CM

## 2020-07-30 DIAGNOSIS — M810 Age-related osteoporosis without current pathological fracture: Secondary | ICD-10-CM | POA: Diagnosis not present

## 2020-07-30 DIAGNOSIS — M159 Polyosteoarthritis, unspecified: Secondary | ICD-10-CM

## 2020-07-30 MED ORDER — SUMATRIPTAN SUCCINATE 100 MG PO TABS: 100.0000 mg | ORAL_TABLET | Freq: Once | ORAL | 3 refills | Status: DC | PRN

## 2020-07-30 MED ORDER — SIMVASTATIN 40 MG PO TABS: 40.0000 mg | ORAL_TABLET | Freq: Every day | ORAL | 3 refills | Status: DC

## 2020-07-30 MED ORDER — DICLOFENAC SODIUM 1 % EX GEL: 2.0000 g | Freq: Four times a day (QID) | CUTANEOUS | 5 refills | Status: DC

## 2020-07-30 MED ORDER — TRIAMTERENE-HCTZ 37.5-25 MG PO TABS: 1.0000 | ORAL_TABLET | Freq: Every day | ORAL | 3 refills | Status: DC

## 2020-07-30 NOTE — Addendum Note (Signed)
Addended by: Marrion Coy on: 07/30/2020 09:57 AM   Modules accepted: Orders

## 2020-07-30 NOTE — Progress Notes (Signed)
Subjective:    Patient ID: Rachael Mitchell, female    DOB: 06-11-1946, 74 y.o.   MRN: [id number]  HPI Here to follow up on issues. She feels well in general. Her arthritis still troubles her, especially in the hands, but the Diclofenac gel helps. She goes to water aerobics twice a week. Her migraines are stable, averaging a couple a month. Her BP is stable at home.    Review of Systems  Constitutional: Negative.   HENT: Negative.   Eyes: Negative.   Respiratory: Negative.   Cardiovascular: Negative.   Gastrointestinal: Negative.   Genitourinary: Negative for decreased urine volume, difficulty urinating, dyspareunia, dysuria, enuresis, flank pain, frequency, hematuria, pelvic pain and urgency.  Musculoskeletal: Positive for arthralgias.  Skin: Negative.   Neurological: Negative.   Psychiatric/Behavioral: Negative.        Objective:   Physical Exam Constitutional:      General: She is not in acute distress.    Appearance: Normal appearance. She is well-developed and well-nourished.  HENT:     Head: Normocephalic and atraumatic.     Right Ear: External ear normal.     Left Ear: External ear normal.     Nose: Nose normal.     Mouth/Throat:     Mouth: Oropharynx is clear and moist.     Pharynx: No oropharyngeal exudate.  Eyes:     General: No scleral icterus.    Extraocular Movements: EOM normal.     Conjunctiva/sclera: Conjunctivae normal.     Pupils: Pupils are equal, round, and reactive to light.  Neck:     Thyroid: No thyromegaly.     Vascular: No JVD.  Cardiovascular:     Rate and Rhythm: Normal rate and regular rhythm.     Pulses: Intact distal pulses.     Heart sounds: Normal heart sounds. No murmur heard. No friction rub. No gallop.   Pulmonary:     Effort: Pulmonary effort is normal. No respiratory distress.     Breath sounds: Normal breath sounds. No wheezing or rales.  Chest:     Chest wall: No tenderness.  Abdominal:     General: Bowel sounds are normal.  There is no distension.     Palpations: Abdomen is soft. There is no mass.     Tenderness: There is no abdominal tenderness. There is no guarding or rebound.  Musculoskeletal:        General: No tenderness or edema. Normal range of motion.     Cervical back: Normal range of motion and neck supple.  Lymphadenopathy:     Cervical: No cervical adenopathy.  Skin:    General: Skin is warm and dry.     Findings: No erythema or rash.  Neurological:     Mental Status: She is alert and oriented to person, place, and time.     Cranial Nerves: No cranial nerve deficit.     Motor: No abnormal muscle tone.     Coordination: Coordination normal.     Deep Tendon Reflexes: Reflexes are normal and symmetric. Reflexes normal.  Psychiatric:        Mood and Affect: Mood and affect normal.        Behavior: Behavior normal.        Thought Content: Thought content normal.        Judgment: Judgment normal.           Assessment & Plan:  Her OA is stable. Her HTN and migraines are stable. We will get fasting  labs to check lipids, an A1c, etc. Set up another DEXA.  Alysia Penna, MD

## 2020-07-31 LAB — HEPATIC FUNCTION PANEL
AG Ratio: 1.8 (calc) (ref 1.0–2.5)
ALT: 23 U/L (ref 6–29)
AST: 30 U/L (ref 10–35)
Albumin: 4.5 g/dL (ref 3.6–5.1)
Alkaline phosphatase (APISO): 49 U/L (ref 37–153)
Bilirubin, Direct: 0.1 mg/dL (ref 0.0–0.2)
Globulin: 2.5 g/dL (calc) (ref 1.9–3.7)
Indirect Bilirubin: 0.3 mg/dL (calc) (ref 0.2–1.2)
Total Bilirubin: 0.4 mg/dL (ref 0.2–1.2)
Total Protein: 7 g/dL (ref 6.1–8.1)

## 2020-07-31 LAB — CBC WITH DIFFERENTIAL/PLATELET
Absolute Monocytes: 533 cells/uL (ref 200–950)
Basophils Absolute: 72 cells/uL (ref 0–200)
Basophils Relative: 1 %
Eosinophils Absolute: 137 cells/uL (ref 15–500)
Eosinophils Relative: 1.9 %
HCT: 41.2 % (ref 35.0–45.0)
Hemoglobin: 13.8 g/dL (ref 11.7–15.5)
Lymphs Abs: 2743 cells/uL (ref 850–3900)
MCH: 30.5 pg (ref 27.0–33.0)
MCHC: 33.5 g/dL (ref 32.0–36.0)
MCV: 91.2 fL (ref 80.0–100.0)
MPV: 9.5 fL (ref 7.5–12.5)
Monocytes Relative: 7.4 %
Neutro Abs: 3715 cells/uL (ref 1500–7800)
Neutrophils Relative %: 51.6 %
Platelets: 435 10*3/uL — ABNORMAL HIGH (ref 140–400)
RBC: 4.52 10*6/uL (ref 3.80–5.10)
RDW: 11.8 % (ref 11.0–15.0)
Total Lymphocyte: 38.1 %
WBC: 7.2 10*3/uL (ref 3.8–10.8)

## 2020-07-31 LAB — LIPID PANEL
Cholesterol: 150 mg/dL (ref <200)
HDL: 49 mg/dL — ABNORMAL LOW (ref >=50)
LDL Cholesterol (Calc): 77 mg/dL (calc)
Non-HDL Cholesterol (Calc): 101 mg/dL (calc) (ref <130)
Total CHOL/HDL Ratio: 3.1 (calc) (ref <5.0)
Triglycerides: 137 mg/dL (ref <150)

## 2020-07-31 LAB — BASIC METABOLIC PANEL WITH GFR
BUN: 17 mg/dL (ref 7–25)
CO2: 28 mmol/L (ref 20–32)
Calcium: 10.5 mg/dL — ABNORMAL HIGH (ref 8.6–10.4)
Chloride: 103 mmol/L (ref 98–110)
Creat: 0.77 mg/dL (ref 0.60–0.93)
GFR, Est African American: 88 mL/min/{1.73_m2} (ref >=60)
GFR, Est Non African American: 76 mL/min/{1.73_m2} (ref >=60)
Glucose, Bld: 103 mg/dL — ABNORMAL HIGH (ref 65–99)
Potassium: 4.9 mmol/L (ref 3.5–5.3)
Sodium: 139 mmol/L (ref 135–146)

## 2020-07-31 LAB — VITAMIN D 25 HYDROXY (VIT D DEFICIENCY, FRACTURES): Vit D, 25-Hydroxy: 52 ng/mL (ref 30–100)

## 2020-07-31 LAB — HEMOGLOBIN A1C
Hgb A1c MFr Bld: 5.5 % of total Hgb (ref <5.7)
Mean Plasma Glucose: 111 mg/dL
eAG (mmol/L): 6.2 mmol/L

## 2020-07-31 LAB — TSH: TSH: 1.41 mIU/L (ref 0.40–4.50)

## 2020-08-05 ENCOUNTER — Ambulatory Visit (INDEPENDENT_AMBULATORY_CARE_PROVIDER_SITE_OTHER)
Admission: RE | Admit: 2020-08-05 | Discharge: 2020-08-05 | Disposition: A | Discharge status: Home / Self Care | Payer: Medicare Other | Source: Ambulatory Visit | Attending: Family Medicine | Admitting: Family Medicine

## 2020-08-05 ENCOUNTER — Other Ambulatory Visit: Payer: Self-pay

## 2020-08-05 DIAGNOSIS — M810 Age-related osteoporosis without current pathological fracture: Secondary | ICD-10-CM

## 2020-08-13 ENCOUNTER — Telehealth: Payer: Self-pay | Admitting: Family Medicine

## 2020-08-13 NOTE — Telephone Encounter (Signed)
Pt notified of lab results and voiced understanding

## 2020-08-13 NOTE — Telephone Encounter (Signed)
Pt is returning the call to the office and would like to see if someone can give her a call back with her lab results to her cell phone.

## 2020-10-02 ENCOUNTER — Other Ambulatory Visit: Payer: Self-pay | Admitting: Family Medicine

## 2020-10-28 ENCOUNTER — Other Ambulatory Visit: Payer: Self-pay | Admitting: Family Medicine

## 2020-10-28 DIAGNOSIS — Z1231 Encounter for screening mammogram for malignant neoplasm of breast: Secondary | ICD-10-CM

## 2020-12-07 DIAGNOSIS — H35372 Puckering of macula, left eye: Secondary | ICD-10-CM | POA: Diagnosis not present

## 2020-12-07 DIAGNOSIS — H04123 Dry eye syndrome of bilateral lacrimal glands: Secondary | ICD-10-CM | POA: Diagnosis not present

## 2020-12-07 DIAGNOSIS — H524 Presbyopia: Secondary | ICD-10-CM | POA: Diagnosis not present

## 2020-12-17 ENCOUNTER — Telehealth (INDEPENDENT_AMBULATORY_CARE_PROVIDER_SITE_OTHER): Payer: Medicare Other | Admitting: Family Medicine

## 2020-12-17 ENCOUNTER — Encounter: Payer: Self-pay | Admitting: Family Medicine

## 2020-12-17 VITALS — BP 152/70 | Temp 98.6°F | Wt 146.0 lb

## 2020-12-17 DIAGNOSIS — R197 Diarrhea, unspecified: Secondary | ICD-10-CM | POA: Diagnosis not present

## 2020-12-17 DIAGNOSIS — R112 Nausea with vomiting, unspecified: Secondary | ICD-10-CM | POA: Diagnosis not present

## 2020-12-17 MED ORDER — ONDANSETRON HCL 4 MG PO TABS: 4.0000 mg | ORAL_TABLET | Freq: Three times a day (TID) | ORAL | 0 refills | Status: DC | PRN

## 2020-12-17 NOTE — Progress Notes (Signed)
Virtual Visit via Video Note  I connected with Rachael Mitchell  on 12/17/20 at 12:00 PM EDT by a video enabled telemedicine application and verified that I am speaking with the correct person using two identifiers.  Location patient: home, [place] Location provider:work or home office Persons participating in the virtual visit: patient, provider  I discussed the limitations of evaluation and management by telemedicine and the availability of in person appointments. The patient expressed understanding and agreed to proceed.   HPI:  Acute telemedicine visit for nausea and vomiting: -Onset: 3 days ago -Symptoms include: nausea, vomiting several times, watery diarrhea (4-5 times), headache  -no known sick contacts, travel, recent abx -did a home covid test this morning which was normal -Denies:resp symptoms, CP, SOB, constant or severe or focal abd pain, inability to eat/drink/get out of bed, fever, body aches -Has tried: none -Pertinent past medical history: HTN and see below -Pertinent medication allergies: nkda -COVID-19 vaccine status: has had 2 doses and a booster; had flu shot  ROS: See pertinent positives and negatives per HPI.  Past Medical History:  Diagnosis Date  . Arthritis   . Cancer (Rachael Mitchell)    basal cell CA- face and back  . Hyperlipidemia   . Hypertension   . Migraine   . Osteopenia    last DEXA 06-09-11  . Vitamin D deficiency     Past Surgical History:  Procedure Laterality Date  . BASAL CELL CARCINOMA EXCISION     face back  . CATARACT EXTRACTION, BILATERAL    . colonoscopy  11/27/2017   per Dr. Hilarie Mitchell, benign polyps, repeat in 5 yrs (hx of adenomas)   . polpectomy  2007   colon-in [place].  Marland Kitchen POLYPECTOMY       Current Outpatient Medications:  .  amLODipine (NORVASC) 5 MG tablet, TAKE 1 TABLET BY MOUTH EVERY DAY, Disp: 90 tablet, Rfl: 3 .  Ascorbic Acid (VITAMIN C) 500 MG tablet, Take 500 mg by mouth 2 (two) times daily., Disp: , Rfl:  .  aspirin 81 MG tablet, Take 81 mg  by mouth daily., Disp: , Rfl:  .  Calcium Carbonate-Vitamin D 600-400 MG-UNIT tablet, Take 1 tablet by mouth 2 (two) times daily., Disp: , Rfl:  .  cholecalciferol (VITAMIN D) 1000 UNITS tablet, Take 2,000 Units by mouth daily. Take 2, Disp: , Rfl:  .  diclofenac Sodium (VOLTAREN) 1 % GEL, Apply 2 g topically 4 (four) times daily., Disp: 350 g, Rfl: 5 .  fenofibrate 160 MG tablet, TAKE 1 TABLET BY MOUTH EVERY DAY, Disp: 90 tablet, Rfl: 1 .  fish oil-omega-3 fatty acids 1000 MG capsule, Take 1,200 mg by mouth daily., Disp: , Rfl:  .  glucosamine-chondroitin 500-400 MG tablet, Take 1 tablet by mouth daily. Takes 1500-1200 mg 2 tablets daily, Disp: , Rfl:  .  Multiple Vitamin (MULTIVITAMIN) tablet, Take 1 tablet by mouth daily., Disp: , Rfl:  .  ondansetron (ZOFRAN) 4 MG tablet, Take 1 tablet (4 mg total) by mouth every 8 (eight) hours as needed for nausea or vomiting., Disp: 20 tablet, Rfl: 0 .  simvastatin (ZOCOR) 40 MG tablet, Take 1 tablet (40 mg total) by mouth at bedtime., Disp: 90 tablet, Rfl: 3 .  SUMAtriptan (IMITREX) 100 MG tablet, Take 1 tablet (100 mg total) by mouth once as needed., Disp: 30 tablet, Rfl: 3 .  triamterene-hydrochlorothiazide (MAXZIDE-25) 37.5-25 MG tablet, Take 1 tablet by mouth daily., Disp: 90 tablet, Rfl: 3  EXAM:  VITALS per patient if applicable:  GENERAL: alert, oriented, appears well and in no acute distress  HEENT: atraumatic, conjunttiva clear, no obvious abnormalities on inspection of external nose and ears  NECK: normal movements of the head and neck  LUNGS: on inspection no signs of respiratory distress, breathing rate appears normal, no obvious gross SOB, gasping or wheezing  CV: no obvious cyanosis  MS: moves all visible extremities without noticeable abnormality  PSYCH/NEURO: pleasant and cooperative, no obvious depression or anxiety, speech and thought processing grossly intact  ASSESSMENT AND PLAN:  Discussed the following assessment and  plan:  Nausea and vomiting, intractability of vomiting not specified, unspecified vomiting type  Diarrhea, unspecified type  -we discussed possible serious and likely etiologies, options for evaluation and workup, limitations of telemedicine visit vs in person visit, treatment, treatment risks and precautions. Pt prefers to treat via telemedicine empirically rather than in person at this moment.  Query gastroenteritis given high prevalence of the symptoms in the community currently versus other.  Her symptoms reoccurred after eating a whole tab of yogurt and eating ice cream.  Discussed a variety of possible etiologies, though gastroenteritis is more likely.  Did advise a second COVID test 24 to 36 hours after the first, since she used a home test.  She has opted to try oral hydration, a dairy free diet, Imodium, Zofran and close observation.  She agrees to seek prompt in person care if her symptoms are worsening or not improving with these measures over the next 1 to 2 days or if new symptoms arise.  She is able to eat and drink currently. Scheduled follow up with PCP offered: Agrees to follow-up as needed Advised to seek prompt in person care if worsening, new symptoms arise, or if is not improving with treatment. Discussed options for inperson care if PCP office not available. Did let this patient know that I only do telemedicine on Tuesdays and Thursdays for [place]. Advised to schedule follow up visit with PCP or [facility] if any further questions or concerns to avoid delays in care.   I discussed the assessment and treatment plan with the patient. The patient was provided an opportunity to ask questions and all were answered. The patient agreed with the plan and demonstrated an understanding of the instructions.     Rachael Kern, DO

## 2020-12-17 NOTE — Patient Instructions (Signed)
-  I sent the medication(s) we discussed to your pharmacy: Meds ordered this encounter  Medications  . ondansetron (ZOFRAN) 4 MG tablet    Sig: Take 1 tablet (4 mg total) by mouth every 8 (eight) hours as needed for nausea or vomiting.    Dispense:  20 tablet    Refill:  0   Do a second home COVID test 26 to 36 hours after your first home test, or seek in person testing.  If you have a positive COVID test, please schedule follow-up video visit through your primary care office or through [facility] to discuss treatment options.  Can use Imodium if you have any further diarrhea.  Follow instructions on the product.  Avoid dairy products.  Drink plenty of fluids.  If you are not eating, please drink fluids with electrolytes.  I hope you are feeling better soon!  Seek in person care promptly if your symptoms worsen, new concerns arise or you are not improving with treatment.  It was nice to meet you today. I help  out with telemedicine visits on Tuesdays and Thursdays and am available for visits on those days. If you have any concerns or questions following this visit please schedule a follow up visit with your Primary Care doctor or seek care at a local urgent care clinic to avoid delays in care.

## 2020-12-21 ENCOUNTER — Other Ambulatory Visit: Payer: Self-pay

## 2020-12-21 ENCOUNTER — Ambulatory Visit
Admission: RE | Admit: 2020-12-21 | Discharge: 2020-12-21 | Disposition: A | Discharge status: Home / Self Care | Payer: Medicare Other | Source: Ambulatory Visit | Attending: Family Medicine | Admitting: Family Medicine

## 2020-12-21 DIAGNOSIS — Z1231 Encounter for screening mammogram for malignant neoplasm of breast: Secondary | ICD-10-CM | POA: Diagnosis not present

## 2021-01-27 ENCOUNTER — Other Ambulatory Visit: Payer: Self-pay

## 2021-01-28 ENCOUNTER — Encounter: Payer: Self-pay | Admitting: Family Medicine

## 2021-01-28 ENCOUNTER — Ambulatory Visit (INDEPENDENT_AMBULATORY_CARE_PROVIDER_SITE_OTHER): Payer: Medicare Other | Admitting: Family Medicine

## 2021-01-28 VITALS — BP 128/62 | HR 76 | Temp 98.5°F | Wt 148.0 lb

## 2021-01-28 DIAGNOSIS — I10 Essential (primary) hypertension: Secondary | ICD-10-CM

## 2021-01-28 NOTE — Progress Notes (Signed)
   Subjective:    Patient ID: Rachael Mitchell, female    DOB: Dec 21, 1945, 75 y.o.   MRN: [id number]  HPI Here to follow up on HTN. She feels great. She enjoys her water aerobics classes.    Review of Systems  Constitutional: Negative.   Respiratory: Negative.    Cardiovascular: Negative.       Objective:   Physical Exam Constitutional:      Appearance: Normal appearance.  Cardiovascular:     Rate and Rhythm: Normal rate and regular rhythm.     Pulses: Normal pulses.     Heart sounds: Normal heart sounds.  Pulmonary:     Effort: Pulmonary effort is normal.     Breath sounds: Normal breath sounds.  Musculoskeletal:     Right lower leg: No edema.     Left lower leg: No edema.  Neurological:     Mental Status: She is alert.          Assessment & Plan:  HTN, stable. Recheck in 6 months.  Alysia Penna, MD

## 2021-01-29 ENCOUNTER — Telehealth: Payer: Self-pay | Admitting: Family Medicine

## 2021-01-29 NOTE — Chronic Care Management (AMB) (Signed)
  Chronic Care Management   Note  01/29/2021 Name: Hensley Aziz MRN: [id number] DOB: [birth year]  [name] is a 74 y.o. year old female who is a primary care patient of Laurey Morale, MD. I reached out to Stacie Acres by phone today in response to a referral sent by Ms. Shianne Avitia's PCP, Laurey Morale, MD.   [name] was given information about Chronic Care Management services today including:  CCM service includes personalized support from designated clinical staff supervised by her physician, including individualized plan of care and coordination with other care providers 24/7 contact phone numbers for assistance for urgent and routine care needs. Service will only be billed when office clinical staff spend 20 minutes or more in a month to coordinate care. Only one practitioner may furnish and bill the service in a calendar month. The patient may stop CCM services at any time (effective at the end of the month) by phone call to the office staff.   Patient agreed to services and verbal consent obtained.   Follow up plan:   Tatjana Secretary/administrator

## 2021-03-11 DIAGNOSIS — L814 Other melanin hyperpigmentation: Secondary | ICD-10-CM | POA: Diagnosis not present

## 2021-03-11 DIAGNOSIS — D229 Melanocytic nevi, unspecified: Secondary | ICD-10-CM | POA: Diagnosis not present

## 2021-03-11 DIAGNOSIS — L821 Other seborrheic keratosis: Secondary | ICD-10-CM | POA: Diagnosis not present

## 2021-03-11 DIAGNOSIS — D485 Neoplasm of uncertain behavior of skin: Secondary | ICD-10-CM | POA: Diagnosis not present

## 2021-03-11 DIAGNOSIS — L819 Disorder of pigmentation, unspecified: Secondary | ICD-10-CM | POA: Diagnosis not present

## 2021-03-11 DIAGNOSIS — I8393 Asymptomatic varicose veins of bilateral lower extremities: Secondary | ICD-10-CM | POA: Diagnosis not present

## 2021-03-11 DIAGNOSIS — L570 Actinic keratosis: Secondary | ICD-10-CM | POA: Diagnosis not present

## 2021-03-11 DIAGNOSIS — C44511 Basal cell carcinoma of skin of breast: Secondary | ICD-10-CM | POA: Diagnosis not present

## 2021-03-13 ENCOUNTER — Other Ambulatory Visit: Payer: Self-pay | Admitting: Family Medicine

## 2021-03-24 ENCOUNTER — Telehealth: Payer: Self-pay | Admitting: Pharmacist

## 2021-03-24 NOTE — Telephone Encounter (Cosign Needed)
Patient returned my call and left a vm. I called back and left another vm for patient to call back.

## 2021-03-24 NOTE — Chronic Care Management (AMB) (Signed)
Chronic Care Management Pharmacy Assistant   Name: Rachael Mitchell  MRN: [id number] DOB: Dec 19, 1945  Rachael Mitchell is an 75 y.o. year old female who presents for herinitial CCM visit with the clinical pharmacist.  Reason for Encounter: Chart Prep for initial visit with Jeni Salles the clinical pharmacist on 03/29/21.   Conditions to be addressed/monitored: HTN, HLD, and Migraine, Osteoarthritis and Vitamin D deficiency.  Primary concerns for visit include:   Recent office visits:  01/28/21 Alysia Penna MD (PCP) - seen for hypertension. No medication changes. Follow up in 6 months.   12/17/20 Colin Benton DO [facility]) - video visit for nausea and vomiting. Patient started on Ondansetron '4mg'$  very 8 hours as needed. Follow up as needed.   Recent consult visits:  None.   Hospital visits:  None in previous 6 months  Medications: Outpatient Encounter Medications as of 03/24/2021  Medication Sig   amLODipine (NORVASC) 5 MG tablet TAKE 1 TABLET BY MOUTH EVERY DAY   amoxicillin (AMOXIL) 500 MG tablet Take 500 mg by mouth 2 (two) times daily.   Ascorbic Acid (VITAMIN C) 500 MG tablet Take 500 mg by mouth 2 (two) times daily.   aspirin 81 MG tablet Take 81 mg by mouth daily.   Calcium Carbonate-Vitamin D 600-400 MG-UNIT tablet Take 1 tablet by mouth 2 (two) times daily.   cholecalciferol (VITAMIN D) 1000 UNITS tablet Take 2,000 Units by mouth daily. Take 2   diclofenac Sodium (VOLTAREN) 1 % GEL Apply 2 g topically 4 (four) times daily.   fenofibrate 160 MG tablet TAKE 1 TABLET BY MOUTH EVERY DAY   fish oil-omega-3 fatty acids 1000 MG capsule Take 1,200 mg by mouth daily.   glucosamine-chondroitin 500-400 MG tablet Take 1 tablet by mouth daily. Takes 1500-1200 mg 2 tablets daily   Multiple Vitamin (MULTIVITAMIN) tablet Take 1 tablet by mouth daily.   ondansetron (ZOFRAN) 4 MG tablet Take 1 tablet (4 mg total) by mouth every 8 (eight) hours as needed for nausea or vomiting.    simvastatin (ZOCOR) 40 MG tablet Take 1 tablet (40 mg total) by mouth at bedtime.   SUMAtriptan (IMITREX) 100 MG tablet Take 1 tablet (100 mg total) by mouth once as needed.   triamterene-hydrochlorothiazide (MAXZIDE-25) 37.5-25 MG tablet Take 1 tablet by mouth daily.   No facility-administered encounter medications on file as of 03/24/2021.   Fill History: DESONIDE 0.05% CREAM 03/11/2021 30   DICLOFENAC SODIUM 1% GEL 07/30/2020 30   FENOFIBRATE 160 MG TABLET 03/15/2021 90   KETOCONAZOLE 2% CREAM 03/11/2021 30   SUMATRIPTAN SUCC 100 MG TABLET 07/30/2020 90   SIMVASTATIN 40 MG TABLET 10/12/2020 90   TRIAMTERENE-HCTZ 37.5-25 MG TB 03/13/2021 90   AMLODIPINE BESYLATE 5 MG TAB 12/29/2020 90    Initial Questions: Have you seen any other providers since your last visit? Yes. Patient states she has seen her dentist and dermatologist.  Any changes in your medications or health? No changes besides she did have an infection in her tooth.  Any side effects from any medications? No side effects that patient knows of.   Do you have any symptoms or problems not managed by your medications? No. Patient feels her medications work and are doing their job.  Any concerns about your health right now? No concerns at this time.  Has your provider asked that you check blood pressure, blood sugar, or follow special diet at home? Yes. Patient checks her blood pressure here and there.  Do you  get any type of exercise on a regular basis? Patient does water classes two on Tuesdays and thursdays. Patient also does work at home.  Can you think of a goal you would like to reach for your health? Patient would like to lose 5 pounds and improve her arthritis pain. Patient tries to watch her diet and stay active already.  Do you have any problems getting your medications? No issues at this time.   Is there anything that you would like to discuss during the appointment? Patient cannot think of anything at this  time.   Please bring medications and supplements to appointment.  Notes: Spoke with Shirlean Mylar at [facility] and she verified that patient did last have her simvastatin filled as below. Shirlean Mylar went ahead and refilled medication.  Spoke with patient and confirmed her upcoming appointment date and time and to bring her blood pressure machine and her medications with her to her appointment on Monday 03/29/21 at 2pm in office. Patient verbalized understanding and thanked me for my call.   Care Gaps:  AWV - message sent to Ramond Craver CMA to schedule. Influenza vaccine - overdue since 03/22/21   Star Rating Drugs:  Simvastatin '40mg'$  - last filled on 10/12/20 90DS at [place] Pharmacist Assistant [phone]

## 2021-03-29 ENCOUNTER — Ambulatory Visit (INDEPENDENT_AMBULATORY_CARE_PROVIDER_SITE_OTHER): Payer: Medicare Other | Admitting: Pharmacist

## 2021-03-29 ENCOUNTER — Other Ambulatory Visit: Payer: Self-pay

## 2021-03-29 DIAGNOSIS — E782 Mixed hyperlipidemia: Secondary | ICD-10-CM

## 2021-03-29 DIAGNOSIS — I10 Essential (primary) hypertension: Secondary | ICD-10-CM | POA: Diagnosis not present

## 2021-03-29 NOTE — Progress Notes (Signed)
Chronic Care Management Pharmacy Note  04/15/2021 Name:  Rachael Mitchell MRN:  [id number] DOB:  08/22/1946  Summary: BP is at goal < 140/90  Pt reports frequent migraines and sensitivity to light  Recommendations/Changes made from today's visit: -Recommended stopping aspirin given patient age and no CV history -Recommended switching simvastatin to atorvastatin 20 mg to avoid drug interaction -Recommended trial of switching amlodipine to verapamil for migraine prevention benefits  Plan: Follow up in 3-4 weeks for BP assessment and tolerance assessment   Subjective: Rachael Mitchell is an 75 y.o. year old female who is a primary patient of Laurey Morale, MD.  The CCM team was consulted for assistance with disease management and care coordination needs.    Engaged with patient face to face for initial visit in response to provider referral for pharmacy case management and/or care coordination services.   Consent to Services:  The patient was given the following information about Chronic Care Management services today, agreed to services, and gave verbal consent: 1. CCM service includes personalized support from designated clinical staff supervised by the primary care provider, including individualized plan of care and coordination with other care providers 2. 24/7 contact phone numbers for assistance for urgent and routine care needs. 3. Service will only be billed when office clinical staff spend 20 minutes or more in a month to coordinate care. 4. Only one practitioner may furnish and bill the service in a calendar month. 5.The patient may stop CCM services at any time (effective at the end of the month) by phone call to the office staff. 6. The patient will be responsible for cost sharing (co-pay) of up to 20% of the service fee (after annual deductible is met). Patient agreed to services and consent obtained.  Patient Care Team: Laurey Morale, MD as PCP - General Viona Gilmore, [facility] as  Pharmacist (Pharmacist)  Recent office visits: 01/28/21 Alysia Penna MD (PCP) - seen for hypertension. No medication changes. Follow up in 6 months.   12/17/20 Colin Benton DO [facility] Medicine) - video visit for nausea and vomiting. Patient started on Ondansetron 13m very 8 hours as needed. Follow up as needed.   Recent consult visits: None  Hospital visits: None in previous 6 months   Objective:  Lab Results  Component Value Date   CREATININE 0.77 07/30/2020   BUN 17 07/30/2020   GFR 69.07 07/30/2019   GFRNONAA 76 07/30/2020   GFRAA 88 07/30/2020   NA 139 07/30/2020   K 4.9 07/30/2020   CALCIUM 10.5 (H) 07/30/2020   CO2 28 07/30/2020   GLUCOSE 103 (H) 07/30/2020    Lab Results  Component Value Date/Time   HGBA1C 5.5 07/30/2020 09:57 AM   HGBA1C 5.6 07/30/2019 10:36 AM   GFR 69.07 07/30/2019 10:36 AM   GFR 80.53 01/16/2019 09:25 AM    Last diabetic Eye exam: No results found for: HMDIABEYEEXA  Last diabetic Foot exam: No results found for: HMDIABFOOTEX   Lab Results  Component Value Date   CHOL 150 07/30/2020   HDL 49 (L) 07/30/2020   LDLCALC 77 07/30/2020   LDLDIRECT 91.4 08/25/2006   TRIG 137 07/30/2020   CHOLHDL 3.1 07/30/2020    Hepatic Function Latest Ref Rng & Units 07/30/2020 07/30/2019 08/10/2018  Total Protein 6.1 - 8.1 g/dL 7.0 6.8 7.4  Albumin 3.5 - 5.2 g/dL - 4.5 4.5  AST 10 - 35 U/L _0 ALT 6 - 29 U/L _1 Alk Phosphatase  39 - 117 U/L - 55 57  Total Bilirubin 0.2 - 1.2 mg/dL 0.4 0.5 0.3  Bilirubin, Direct 0.0 - 0.2 mg/dL 0.1 0.1 -    Lab Results  Component Value Date/Time   TSH 1.41 07/30/2020 09:57 AM   TSH 1.26 07/30/2019 10:36 AM    CBC Latest Ref Rng & Units 07/30/2020 07/30/2019 07/16/2018  WBC 3.8 - 10.8 Thousand/uL 7.2 8.0 6.9  Hemoglobin 11.7 - 15.5 g/dL 13.8 13.7 13.6  Hematocrit 35.0 - 45.0 % 41.2 39.7 40.0  Platelets 140 - 400 Thousand/uL 435(H) 421.0(H) 393.0    Lab Results  Component Value Date/Time   VD25OH 52  07/30/2020 09:57 AM   VD25OH 59.34 07/30/2019 10:36 AM   VD25OH 53.72 07/16/2018 10:02 AM    Clinical ASCVD: No  The 10-year ASCVD risk score Mikey Bussing DC Jr., et al., 2013) is: 20.5%   Values used to calculate the score:     Age: 52 years     Sex: Female     Is Non-Hispanic African American: No     Diabetic: No     Tobacco smoker: No     Systolic Blood Pressure: 622 mmHg     Is BP treated: Yes     HDL Cholesterol: 49 mg/dL     Total Cholesterol: 150 mg/dL    Depression screen [facility] 2/9 07/30/2020 07/30/2019 07/16/2018  Decreased Interest 0 0 0  Down, Depressed, Hopeless 0 0 0  PHQ - 2 Score 0 0 0     Social History   Tobacco Use  Smoking Status Former   Types: Cigarettes   Quit date: 09/24/1992   Years since quitting: 28.5  Smokeless Tobacco Never   BP Readings from Last 3 Encounters:  01/28/21 128/62  12/17/20 (!) 152/70  07/30/20 (!) 146/74   Pulse Readings from Last 3 Encounters:  01/28/21 76  07/30/20 69  01/28/20 74   Wt Readings from Last 3 Encounters:  01/28/21 148 lb (67.1 kg)  12/17/20 146 lb (66.2 kg)  07/30/20 153 lb (69.4 kg)   BMI Readings from Last 3 Encounters:  01/28/21 24.25 kg/m  12/17/20 23.93 kg/m  07/30/20 25.07 kg/m    Assessment/Interventions: Review of patient past medical history, allergies, medications, health status, including review of consultants reports, laboratory and other test data, was performed as part of comprehensive evaluation and provision of chronic care management services.   SDOH:  (Social Determinants of Health) assessments and interventions performed: Yes SDOH Interventions    Flowsheet Row Most Recent Value  SDOH Interventions   Financial Strain Interventions Intervention Not Indicated  Transportation Interventions Intervention Not Indicated      SDOH Screenings   Alcohol Screen: Not on file  Depression (PHQ2-9): Low Risk    PHQ-2 Score: 0  Financial Resource Strain: Low Risk    Difficulty of Paying Living  Expenses: Not hard at all  Food Insecurity: Not on file  Housing: Not on file  Physical Activity: Not on file  Social Connections: Not on file  Stress: Not on file  Tobacco Use: Medium Risk   Smoking Tobacco Use: Former   Smokeless Tobacco Use: Never  Transportation Needs: No Transportation Needs   Lack of Transportation (Medical): No   Lack of Transportation (Non-Medical): No   Patient stays very busy throughout the week. She usually reserves Mondays for doctor and car appointments. On Tuesday and Thursday she has water classes at the [facility] from 815-10, which includes water walking and aqua arthritis and she sometimes substitute  teaches the classes as well. She is unable to do much other exercise due to the pain in her hips. She will then go to lunch with the people in her class. She also has church meeting on Tuesdays. On Wednesdays, she has Bible study in mornings and sometimes other appointments. On Fridays, she usually has day trips, eats out some, or volunteers with the [employer]. On Sunday, she goes to church and usually spends the day with family and takes an afternoon nap.  Patient has someone doing the cleaning at the house every other week but she does the cooking if she eats at home. Her sister is doing weight watches which has been helpful. She eats about half of what she did and usually has chicken 4-5 days a week, fish twice a week, and beef or pork once. She does eat a lot of fruit and vegetables and avoids fried foods and only occasionally has dessert. She drinks water all day and will travel with diet soda for caffeine, as well as drinks 3 cups of coffee in the morning and sometimes ice tea with artifical sweetener.  She sleeps pretty well most of the time and goes to bed at 10 and gets up at 530-6. She sometimes has trouble falling asleep and will get up to go to bathroom during the night. She does take a 30 minute nap some afternoons.  Patient denies any problems with her  medications and only takes 4 of them every day.   CCM Care Plan  No Known Allergies  Medications Reviewed Today     Reviewed by Viona Gilmore, [facility] (Pharmacist) on 03/29/21 at 1456  Med List Status: <None>   Medication Order Taking? Sig Documenting Provider Last Dose Status Informant  amLODipine (NORVASC) 5 MG tablet [id number] Yes TAKE 1 TABLET BY MOUTH EVERY DAY Laurey Morale, MD Taking Active   Ascorbic Acid (VITAMIN C) 500 MG tablet [id number] Yes Take 500 mg by mouth 2 (two) times daily. [provider] Taking Active   aspirin 81 MG tablet [id number] Yes Take 81 mg by mouth daily. [provider] Taking Active   Calcium Carbonate-Vitamin D 600-400 MG-UNIT tablet [id number] Yes Take 1 tablet by mouth 2 (two) times daily. [provider] Taking Active   cholecalciferol (VITAMIN D) 1000 UNITS tablet [id number] Yes Take 2,000 Units by mouth daily. Take 2 [provider] Taking Active   diclofenac Sodium (VOLTAREN) 1 % GEL [id number] Yes Apply 2 g topically 4 (four) times daily. Laurey Morale, MD Taking Active   fenofibrate 160 MG tablet [id number] Yes TAKE 1 TABLET BY MOUTH EVERY DAY Laurey Morale, MD Taking Active   fish oil-omega-3 fatty acids 1000 MG capsule [id number] Yes Take 1,200 mg by mouth daily. [provider] Taking Active   glucosamine-chondroitin 500-400 MG tablet [id number] Yes Take 1 tablet by mouth daily. Takes 1500-1200 mg 1 tablet daily [provider] Taking Active   Multiple Vitamin (MULTIVITAMIN) tablet [id number] Yes Take 1 tablet by mouth daily. [provider] Taking Active   simvastatin (ZOCOR) 40 MG tablet [id number] Yes Take 1 tablet (40 mg total) by mouth at bedtime. Laurey Morale, MD Taking Active   SUMAtriptan (IMITREX) 100 MG tablet [id number] Yes Take 1 tablet (100 mg total) by mouth once as needed. Laurey Morale, MD Taking Active   triamterene-hydrochlorothiazide [facility]) 37.5-25 MG tablet [id number] Yes  Take 1 tablet by mouth daily. 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    Immunization History  Administered Date(s) Administered   Fluad Quad(high Dose 65+) 05/25/2020   Influenza Split 06/09/2011, 06/11/2012   Influenza Whole 05/28/2007, 06/03/2008, 06/05/2009, 06/07/2010   Influenza, High Dose Seasonal PF 06/18/2015, 06/21/2016, 07/10/2017, 07/16/2018   Influenza,inj,Quad PF,6+ Mos 06/14/2013, 06/16/2014   PFIZER(Purple Top)SARS-COV-2 Vaccination 09/27/2019, 10/23/2019, 06/22/2020, 01/05/2021   Pneumococcal Conjugate-13 06/18/2015   Pneumococcal Polysaccharide-23 05/18/2006, 06/11/2012   Td 04/22/2005   Tdap 06/18/2015   Zoster Recombinat (Shingrix) 01/20/2018, 04/07/2018   Zoster, Live 06/25/2012    Conditions to be addressed/monitored:  Hypertension, Hyperlipidemia, Osteoarthritis, Allergic Rhinitis, and Migraines  Care Plan : [place]  Updates made by Viona Gilmore, [place] since 04/15/2021 12:00 AM     Problem: Problem: Hypertension, Hyperlipidemia, Osteoarthritis, Allergic Rhinitis, and Migraines      Long-Range Goal: Patient-Specific Goal   Start Date: 03/29/2021  Expected End Date: 03/29/2022  This Visit's Progress: On track  Priority: High  Note:   Current Barriers:  Unable to independently monitor therapeutic efficacy Unable to achieve control of migraines   Pharmacist Clinical Goal(s):  Patient will achieve adherence to monitoring guidelines and medication adherence to achieve therapeutic efficacy through collaboration with PharmD and provider.   Interventions: 1:1 collaboration with Laurey Morale, MD  regarding development and update of comprehensive plan of care as evidenced by provider attestation and co-signature Inter-disciplinary care team collaboration (see longitudinal plan of care) Comprehensive medication review performed; medication list updated in electronic medical record  Hypertension (BP goal <140/90) -Uncontrolled -Current treatment: Amlodipine 5 mg 1 tablet daily Triamterence-HCTZ 37.5-25 mg 1 tablet daily -Medications previously tried: none  -Current home readings: doesn't check it often; 130/59, 138/68 (headache/pain) -Current dietary habits: uses lite salt; tries not to overseason (had popcorn); looks at package labels -Current exercise habits: water exercises -Denies hypotensive/hypertensive symptoms -Educated on BP goals and benefits of medications for prevention of heart attack, stroke and kidney damage; Exercise goal of 150 minutes per week; Importance of home blood pressure monitoring; Proper BP monitoring technique; -Counseled to monitor BP at home weekly, document, and provide log at future appointments -Counseled on diet and exercise extensively Recommended to continue current medication  Hyperlipidemia: (LDL goal < 100) -Controlled -Current treatment: Simvastatin 40 mg 1 tablet daily Fenofibrate 160 mg 1 tablet daily Fish oil 1200 mg daily -Medications previously tried: none  -Current dietary patterns: limiting fried foods -Current exercise habits: water exercises -Educated on Cholesterol goals;  Benefits of statin for ASCVD risk reduction; Importance of limiting foods high in cholesterol; Exercise goal of 150 minutes per week; -Counseled on diet and exercise extensively Counseled on drug interaction with simvastatin and amlodipine. Recommended switching to atorvastatin to avoid interaction.  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 Will discuss with PCP about switching amlodipine to verapamil or beta blocker for migraine prevention benefit.  Osteopenia (Goal prevent fractures) -Uncontrolled -Last DEXA Scan: 07/2020   T-Score femoral neck: -2.2  T-Score total hip: n/a  T-Score lumbar spine: 0.2  T-Score forearm radius: n/a  10-year probability of major osteoporotic fracture: 22.5%  10-year probability of hip fracture: 6.2% -Patient is a candidate for pharmacologic treatment due to T-Score -1.0 to -2.5 and 10-year risk of major osteoporotic fracture > 20% and T-Score -1.0 to -2.5 and 10-year risk of hip fracture > 3% -Current treatment  Vitamin D 2000 units daily Calcium-carbonate-vitamin D 600-400 mg-unit  -Medications previously tried: Fosamax (completed 5 years), Evista  -Recommend [phone] units of vitamin D daily. Recommend 1200 mg of calcium daily from dietary and supplemental sources. Recommend weight-bearing and muscle strengthening exercises for building and maintaining bone density. -Counseled on diet and exercise extensively Recommended decreasing to one calcium tablet per day based on current dietary intake.  Osteoarthritis (Goal: minimize pain) -Controlled -Current treatment  Diclofenac gel 1% as needed Glucoasmine-chondroitin 1500-1200 mg 1 tablet daily in AM -Medications previously tried: none  -Recommended to continue current medication   Health Maintenance -Vaccine gaps: influenza -Current therapy:  Vitamin C 500 mg twice daily  Aspirin 81 mg 1 tablet daily at bedtime Multivitamin 50+ for women  -Educated on Cost vs benefit of each product must be carefully weighed by individual consumer -Patient is satisfied with current therapy and denies issues -Counseled on risk vs benefit of aspirin therapy given no cardiac history.  Patient  Goals/Self-Care Activities Patient will:  - take medications as prescribed check blood pressure weekly, document, and provide at future appointments target a minimum of 150 minutes of moderate intensity exercise weekly  Follow Up Plan: The care management team will reach out to the patient again over the next 30 days.        Medication Assistance: None required.  Patient affirms current coverage meets needs.  Compliance/Adherence/Medication fill history: Care Gaps: Influenza vaccine  Star-Rating Drugs: Simvastatin 55m - last [facility] 10/12/20 [facility]   Patient's preferred pharmacy is:  [id number] [place] [place] [id number]: [phone]: [phone] pill box? Yes - 2 weeks at a time Pt endorses 99% compliance -mostly night time is missed if missed  We discussed: Current pharmacy is preferred with insurance plan and patient is satisfied with pharmacy services Patient decided to: Continue current medication management strategy  Care Plan and Follow Up Patient Decision:  Patient agrees to Care Plan and Follow-up.  Plan: Telephone follow up appointment with care management team member scheduled for:  3 months  MaJeni SallesPharmD, BCSan Dimasharmacist [phone]

## 2021-04-14 DIAGNOSIS — C44511 Basal cell carcinoma of skin of breast: Secondary | ICD-10-CM | POA: Diagnosis not present

## 2021-04-14 DIAGNOSIS — L905 Scar conditions and fibrosis of skin: Secondary | ICD-10-CM | POA: Diagnosis not present

## 2021-04-15 NOTE — Patient Instructions (Addendum)
Hi Pat,  It was great to get to meet you in person! Below is a summary of some of the topics we discussed.   Please reach out to me if you have any questions or need anything before our follow up!  Best, Maddie  Jeni Salles, PharmD, [facility] [place]   Visit Information   Goals Addressed             This Visit's Progress    Manage My Medicine       Timeframe:  Short-Term Goal Priority:  High Start Date:                             Expected End Date:                       Follow Up Date 07/06/21    - keep a list of all the medicines I take; vitamins and herbals too - use a pillbox to sort medicine - use an alarm clock or phone to remind me to take my medicine    Why is this important?   These steps will help you keep on track with your medicines.   Notes:        Patient Care Plan: CCM Pharmacy Care Plan     Problem Identified: Problem: Hypertension, Hyperlipidemia, Osteoarthritis, Allergic Rhinitis, and Migraines      Long-Range Goal: Patient-Specific Goal   Start Date: 03/29/2021  Expected End Date: 03/29/2022  This Visit's Progress: On track  Priority: High  Note:   Current Barriers:  Unable to independently monitor therapeutic efficacy Unable to achieve control of migraines   Pharmacist Clinical Goal(s):  Patient will achieve adherence to monitoring guidelines and medication adherence to achieve therapeutic efficacy through collaboration with PharmD and provider.   Interventions: 1:1 collaboration with Laurey Morale, MD regarding development and update of comprehensive plan of care as evidenced by provider attestation and co-signature Inter-disciplinary care team collaboration (see longitudinal plan of care) Comprehensive medication review performed; medication list updated in electronic medical record  Hypertension (BP goal <140/90) -Uncontrolled -Current treatment: Amlodipine 5 mg 1 tablet  daily Triamterence-HCTZ 37.5-25 mg 1 tablet daily -Medications previously tried: none  -Current home readings: doesn't check it often; 130/59, 138/68 (headache/pain) -Current dietary habits: uses lite salt; tries not to overseason (had popcorn); looks at package labels -Current exercise habits: water exercises -Denies hypotensive/hypertensive symptoms -Educated on BP goals and benefits of medications for prevention of heart attack, stroke and kidney damage; Exercise goal of 150 minutes per week; Importance of home blood pressure monitoring; Proper BP monitoring technique; -Counseled to monitor BP at home weekly, document, and provide log at future appointments -Counseled on diet and exercise extensively Recommended to continue current medication  Hyperlipidemia: (LDL goal < 100) -Controlled -Current treatment: Simvastatin 40 mg 1 tablet daily Fenofibrate 160 mg 1 tablet daily Fish oil 1200 mg daily -Medications previously tried: none  -Current dietary patterns: limiting fried foods -Current exercise habits: water exercises -Educated on Cholesterol goals;  Benefits of statin for ASCVD risk reduction; Importance of limiting foods high in cholesterol; Exercise goal of 150 minutes per week; -Counseled on diet and exercise extensively Counseled on drug interaction with simvastatin and amlodipine. Recommended switching to atorvastatin to avoid interaction.  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 Will discuss with PCP about switching amlodipine to verapamil or beta blocker for migraine prevention benefit.  Osteopenia (Goal prevent fractures) -Uncontrolled -Last DEXA Scan: 07/2020   T-Score femoral neck: -2.2  T-Score  total hip: n/a  T-Score lumbar spine: 0.2  T-Score forearm radius: n/a  10-year probability of major osteoporotic fracture: 22.5%  10-year probability of hip fracture: 6.2% -Patient is a candidate for pharmacologic treatment due to T-Score -1.0 to -2.5 and 10-year risk of major osteoporotic fracture > 20% and T-Score -1.0 to -2.5 and 10-year risk of hip fracture > 3% -Current treatment  Vitamin D 2000 units daily Calcium-carbonate-vitamin D 600-400 mg-unit  -Medications previously tried: Fosamax (completed 5 years), Evista  -Recommend [phone] units of vitamin D daily. Recommend 1200 mg of calcium daily from dietary and supplemental sources. Recommend weight-bearing and muscle strengthening exercises for building and maintaining bone density. -Counseled on diet and exercise extensively Recommended decreasing to one calcium tablet per day based on current dietary intake.  Osteoarthritis (Goal: minimize pain) -Controlled -Current treatment  Diclofenac gel 1% as needed Glucoasmine-chondroitin 1500-1200 mg 1 tablet daily in AM -Medications previously tried: none  -Recommended to continue current medication   Health Maintenance -Vaccine gaps: influenza -Current therapy:  Vitamin C 500 mg twice daily  Aspirin 81 mg 1 tablet daily at bedtime Multivitamin 50+ for women  -Educated on Cost vs benefit of each product must be carefully weighed by individual consumer -Patient is satisfied with current therapy and denies issues -Counseled on risk vs benefit of aspirin therapy given no cardiac history.  Patient Goals/Self-Care Activities Patient will:  - take medications as prescribed check blood pressure weekly, document, and provide at future appointments target a minimum of 150 minutes of moderate intensity exercise weekly  Follow Up Plan: The care management team will reach out to the patient again over the next 30 days.       Ms. Clinton was given information about Chronic Care  Management services today including:  CCM service includes personalized support from designated clinical staff supervised by her physician, including individualized plan of care and coordination with other care providers 24/7 contact phone numbers for assistance for urgent and routine care needs. Standard insurance, coinsurance, copays and deductibles apply for chronic care management only during months in which we provide at least 20 minutes of these services. Most insurances cover these services at 100%, however patients may be responsible for any copay, coinsurance and/or deductible if applicable. This service may help you avoid the need for more expensive face-to-face services. Only one practitioner may furnish and bill the service in a calendar month. The patient may stop CCM services at any time (effective at the end of the month) by phone call to the office staff.  Patient agreed to services and verbal consent obtained.   Patient verbalizes understanding of instructions provided today and agrees to view in [place].  The pharmacy team will reach out to the patient again over the next 30 days.   Viona Gilmore, [facility]

## 2021-05-04 ENCOUNTER — Other Ambulatory Visit: Payer: Self-pay

## 2021-05-04 DIAGNOSIS — E782 Mixed hyperlipidemia: Secondary | ICD-10-CM

## 2021-05-04 MED ORDER — ATORVASTATIN CALCIUM 20 MG PO TABS
20.0000 mg | ORAL_TABLET | Freq: Every day | ORAL | 3 refills | Status: DC
Start: 2021-05-04 — End: 2022-05-09

## 2021-05-05 ENCOUNTER — Telehealth: Payer: Self-pay | Admitting: Pharmacist

## 2021-05-05 NOTE — Telephone Encounter (Signed)
Called patient to make her aware of the medication changes after discussion with PCP from CCM visit. Patient is aware to stop taking aspirin and it was removed from her medication list. Patient is also aware that a new prescription was sent in for atorvastatin to replace the simvastatin. Patient verbalized her understanding and is in agreement with the plan.

## 2021-05-18 ENCOUNTER — Telehealth: Payer: Self-pay | Admitting: Pharmacist

## 2021-05-18 NOTE — Chronic Care Management (AMB) (Signed)
Chronic Care Management Pharmacy Assistant   Name: Rachael Mitchell  MRN: [id number] DOB: 07-Oct-1945   Reason for Encounter: Disease State / Hypertension Assesment Call   Conditions to be addressed/monitored: HTN   Recent office visits:  None  Recent consult visits:  None  Hospital visits:  None in previous 6 months  Medications: Outpatient Encounter Medications as of 05/18/2021  Medication Sig   amLODipine (NORVASC) 5 MG tablet TAKE 1 TABLET BY MOUTH EVERY DAY   Ascorbic Acid (VITAMIN C) 500 MG tablet Take 500 mg by mouth 2 (two) times daily.   atorvastatin (LIPITOR) 20 MG tablet Take 1 tablet (20 mg total) by mouth daily.   Calcium Carbonate-Vitamin D 600-400 MG-UNIT tablet Take 1 tablet by mouth 2 (two) times daily.   cholecalciferol (VITAMIN D) 1000 UNITS tablet Take 2,000 Units by mouth daily. Take 2   diclofenac Sodium (VOLTAREN) 1 % GEL Apply 2 g topically 4 (four) times daily.   fenofibrate 160 MG tablet TAKE 1 TABLET BY MOUTH EVERY DAY   fish oil-omega-3 fatty acids 1000 MG capsule Take 1,200 mg by mouth daily.   glucosamine-chondroitin 500-400 MG tablet Take 1 tablet by mouth daily. Takes 1500-1200 mg 1 tablet daily   Multiple Vitamin (MULTIVITAMIN) tablet Take 1 tablet by mouth daily.   SUMAtriptan (IMITREX) 100 MG tablet Take 1 tablet (100 mg total) by mouth once as needed.   triamterene-hydrochlorothiazide (MAXZIDE-25) 37.5-25 MG tablet Take 1 tablet by mouth daily.   No facility-administered encounter medications on file as of 05/18/2021.   Fill History: ATORVASTATIN 20 MG TABLET 05/04/2021 90   DESONIDE 0.05% CREAM 03/11/2021 30   FENOFIBRATE 160 MG TABLET 03/15/2021 90   TRIAMTERENE-HCTZ 37.5-25 MG TB 03/13/2021 90   AMLODIPINE BESYLATE 5 MG TAB 03/30/2021 90   Reviewed chart prior to disease state call. Spoke with patient regarding BP  Recent Office Vitals: BP Readings from Last 3 Encounters:  01/28/21 128/62  12/17/20 (!) 152/70  07/30/20 (!)  146/74   Pulse Readings from Last 3 Encounters:  01/28/21 76  07/30/20 69  01/28/20 74    Wt Readings from Last 3 Encounters:  01/28/21 148 lb (67.1 kg)  12/17/20 146 lb (66.2 kg)  07/30/20 153 lb (69.4 kg)     Kidney Function Lab Results  Component Value Date/Time   CREATININE 0.77 07/30/2020 09:57 AM   CREATININE 0.81 07/30/2019 10:36 AM   CREATININE 0.71 01/16/2019 09:25 AM   GFR 69.07 07/30/2019 10:36 AM   GFRNONAA 76 07/30/2020 09:57 AM   GFRAA 88 07/30/2020 09:57 AM    BMP Latest Ref Rng & Units 07/30/2020 07/30/2019 01/16/2019  Glucose 65 - 99 mg/dL 103(H) 108(H) 92  BUN 7 - 25 mg/dL 17 16 16   Creatinine 0.60 - 0.93 mg/dL 0.77 0.81 0.71  BUN/Creat Ratio 6 - 22 (calc) NOT APPLICABLE - -  Sodium 637 - 146 mmol/L 139 138 137  Potassium 3.5 - 5.3 mmol/L 4.9 3.9 4.6  Chloride 98 - 110 mmol/L 103 102 100  CO2 20 - 32 mmol/L 28 29 27   Calcium 8.6 - 10.4 mg/dL 10.5(H) 10.1 10.1    Current antihypertensive regimen:  Amlodipine 5 mg take 1 tablet daily Maxzide 37.5/25 mg take 1 tablet daily  How often are you checking your Blood Pressure? infrequently  Current home BP readings: 04/08/2021 128/61 and 05/10/2021 137/64  What recent interventions/DTPs have been made by any provider to improve Blood Pressure control since last CPP Visit: No  Any recent  hospitalizations or ED visits since last visit with CPP? No  What diet changes have been made to improve Blood Pressure Control?  Patient states she will have for breakfast she will have fruit, cereal or eggs, sometimes skips breakfast, for lunch a salad or a sandwich and for dinner meat and vegetable,  meats are mostly chicken and fish.  What exercise is being done to improve your Blood Pressure Control?  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 She does a little housework.  Adherence Review: Is the patient currently  on ACE/ARB medication? No Does the patient have >5 day gap between last estimated fill dates? No   Care Gaps:  AWV - message sent to Ramond Craver to schedule Flu vaccine - due  Star Rating Drugs:  Atorvastatin 20mg  - last filled 05/10/2021 90DS at [facility] [phone]

## 2021-05-21 IMAGING — MG DIGITAL SCREENING BILAT W/ TOMO W/ CAD
8 series · 8 of 24 positions shown · non-contrast
Comparison: Previous exam(s).

CLINICAL DATA: Screening.

EXAM:
DIGITAL SCREENING BILATERAL MAMMOGRAM WITH TOMO AND CAD

[R MLO synth-2D]
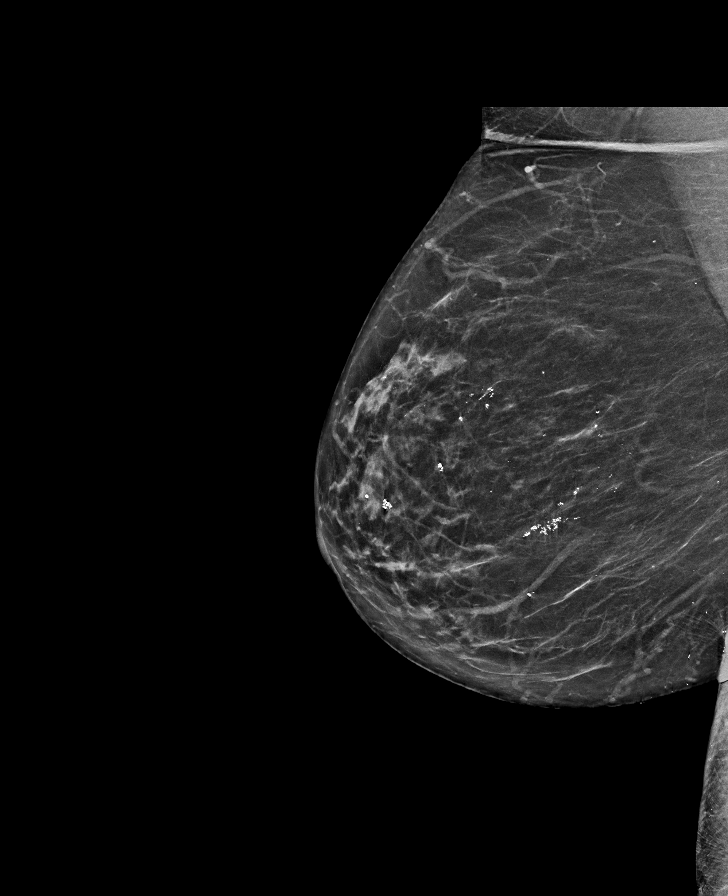

[L CC synth-2D]
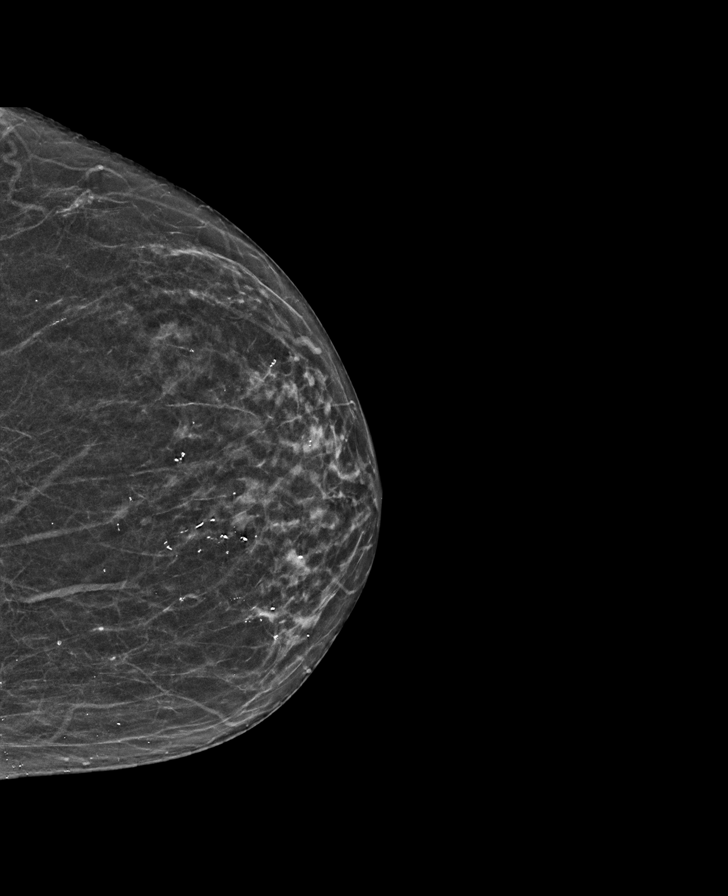

[L MLO synth-2D]
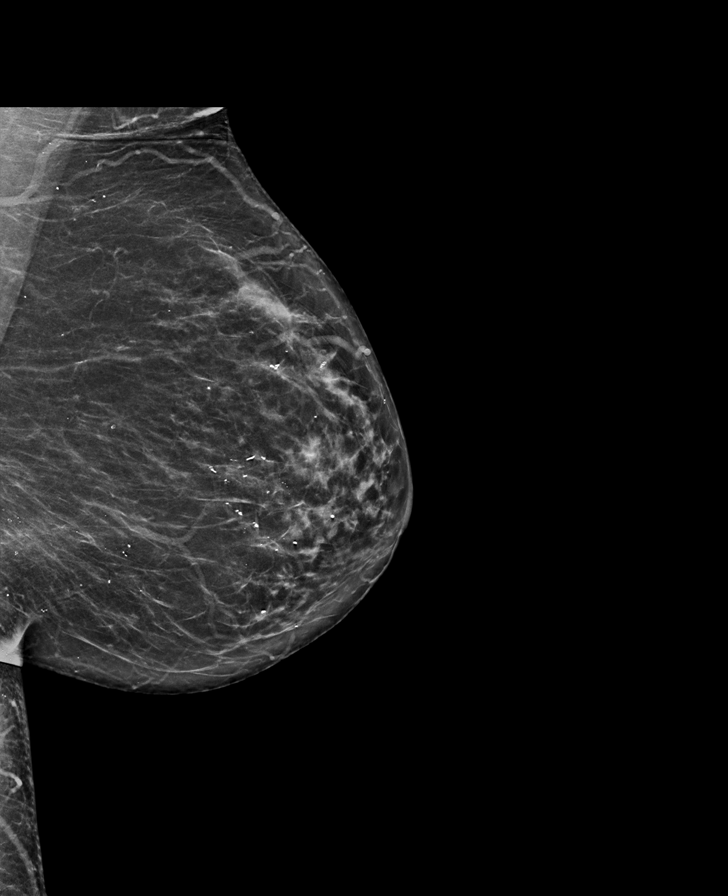

[R CC synth-2D]
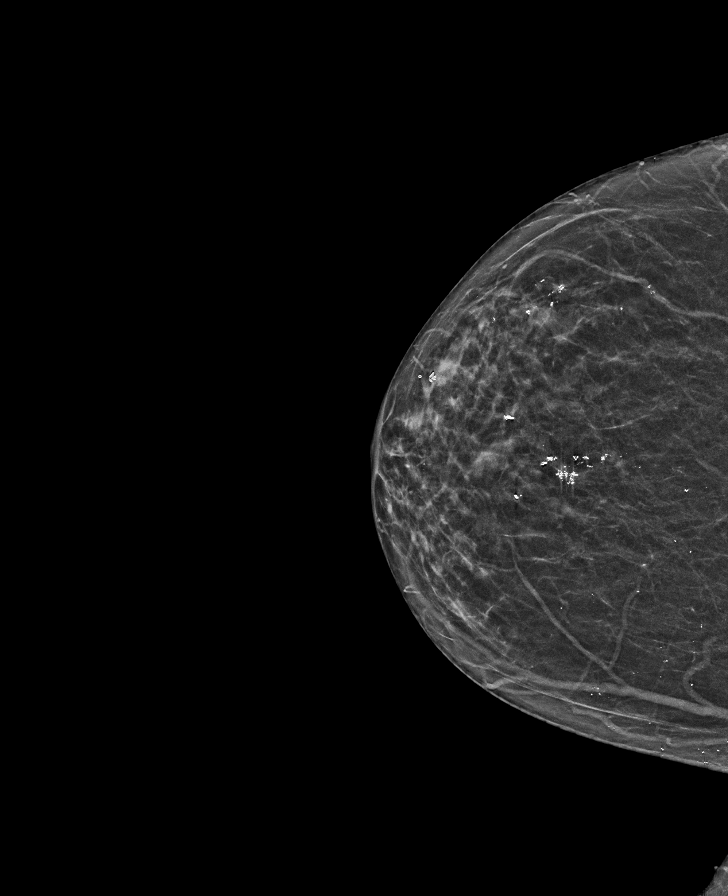

[L CC tomo · tomo slice 26/51.0]
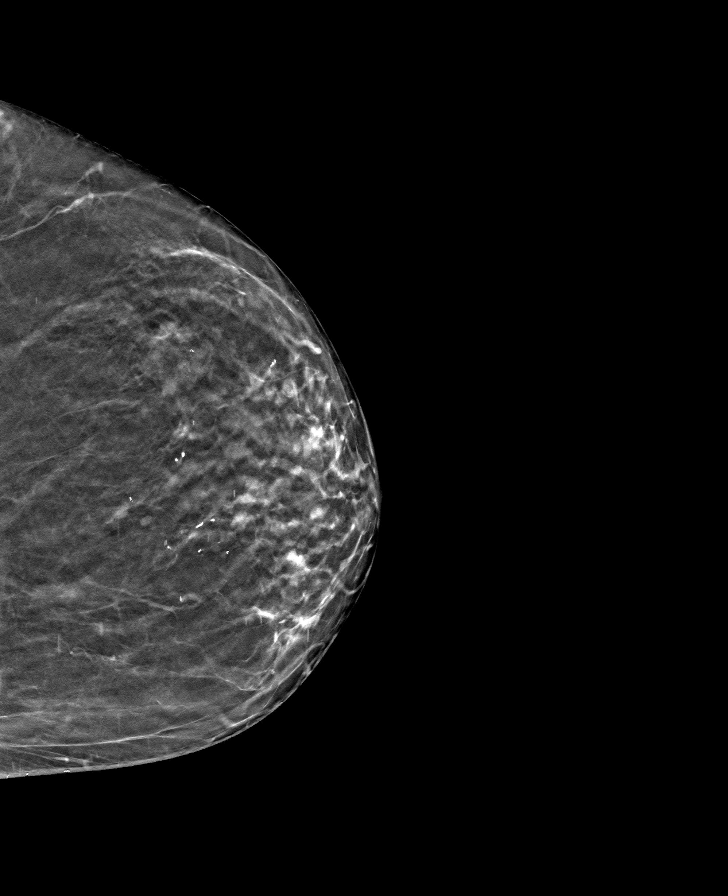

[R MLO tomo · tomo slice 31/62.0]
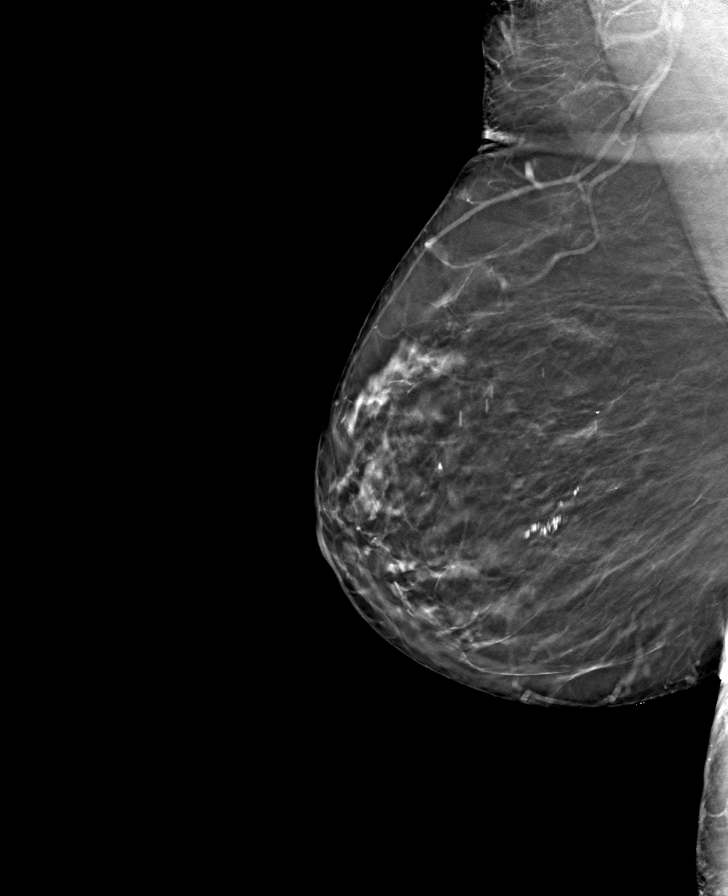

[R CC tomo · tomo slice 26/51.0]
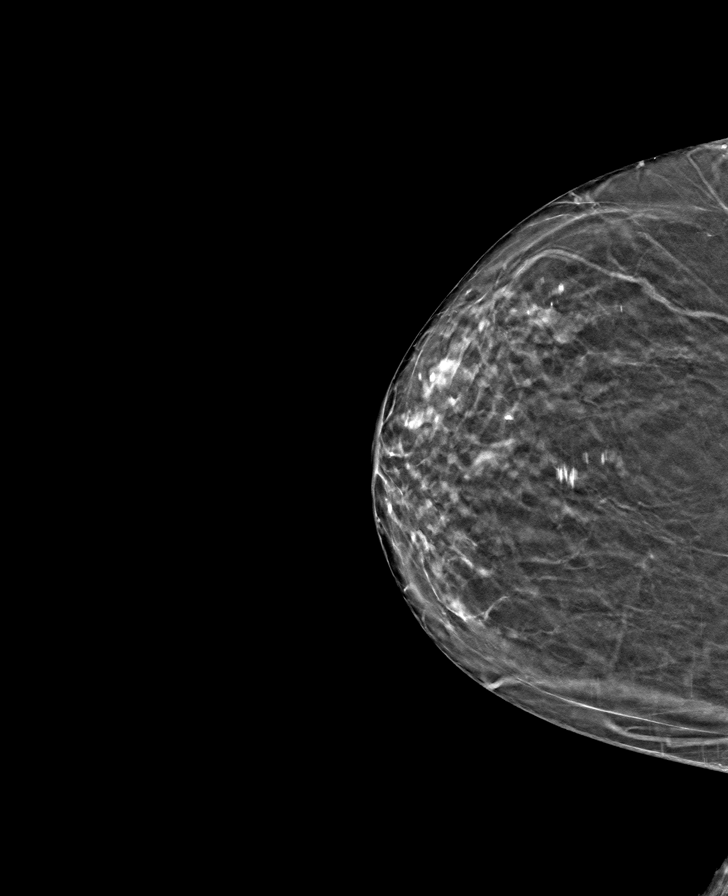

[L MLO tomo · tomo slice 33/65.0]
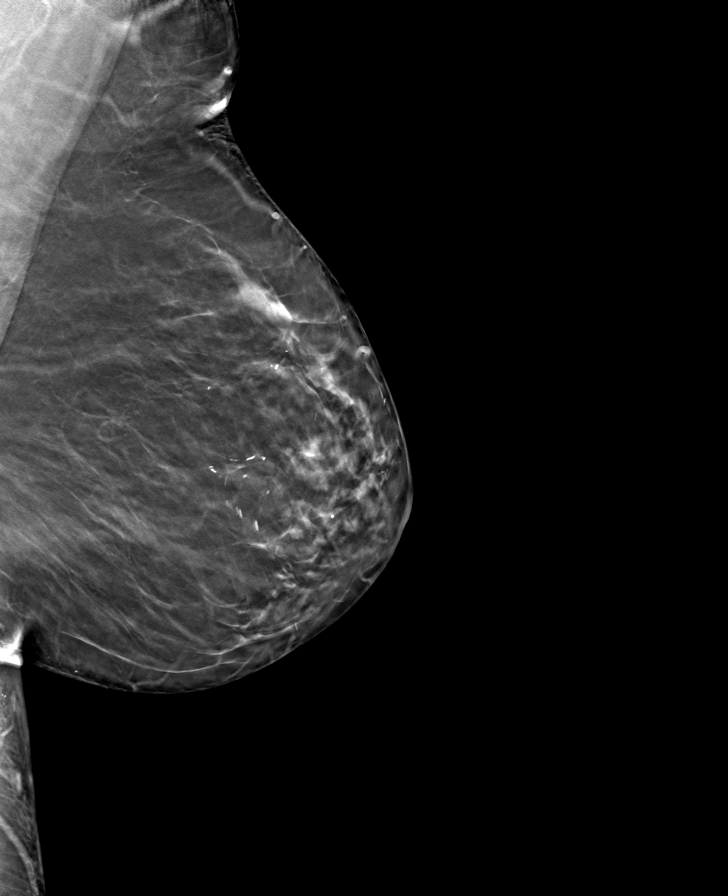

[8 of 24 positions shown; findings below may reference images not displayed]

ACR Breast Density Category b: There are scattered areas of
fibroglandular density.
FINDINGS: In the right breast, a possible mass warrants further evaluation. In
the left breast, no findings suspicious for malignancy. Images were
processed with CAD.
IMPRESSION: Further evaluation is suggested for possible mass in the right
breast.

RECOMMENDATION:
Diagnostic mammogram and possibly ultrasound of the right breast.
(Code:T1-A-550)

The patient will be contacted regarding the findings, and additional
imaging will be scheduled.

BI-RADS CATEGORY  0: Incomplete. Need additional imaging evaluation
and/or prior mammograms for comparison.

## 2021-06-09 DIAGNOSIS — Z23 Encounter for immunization: Secondary | ICD-10-CM | POA: Diagnosis not present

## 2021-06-23 ENCOUNTER — Ambulatory Visit (INDEPENDENT_AMBULATORY_CARE_PROVIDER_SITE_OTHER): Payer: Medicare Other

## 2021-06-23 ENCOUNTER — Ambulatory Visit (INDEPENDENT_AMBULATORY_CARE_PROVIDER_SITE_OTHER): Payer: Medicare Other | Admitting: Family Medicine

## 2021-06-23 ENCOUNTER — Encounter: Payer: Self-pay | Admitting: Family Medicine

## 2021-06-23 ENCOUNTER — Other Ambulatory Visit: Payer: Self-pay

## 2021-06-23 VITALS — BP 134/72 | HR 104 | Temp 98.6°F | Wt 149.6 lb

## 2021-06-23 DIAGNOSIS — M7918 Myalgia, other site: Secondary | ICD-10-CM | POA: Diagnosis not present

## 2021-06-23 DIAGNOSIS — M85851 Other specified disorders of bone density and structure, right thigh: Secondary | ICD-10-CM

## 2021-06-23 DIAGNOSIS — M25551 Pain in right hip: Secondary | ICD-10-CM

## 2021-06-23 DIAGNOSIS — W19XXXA Unspecified fall, initial encounter: Secondary | ICD-10-CM | POA: Diagnosis not present

## 2021-06-23 DIAGNOSIS — M85852 Other specified disorders of bone density and structure, left thigh: Secondary | ICD-10-CM

## 2021-06-23 DIAGNOSIS — M47816 Spondylosis without myelopathy or radiculopathy, lumbar region: Secondary | ICD-10-CM | POA: Diagnosis not present

## 2021-06-23 DIAGNOSIS — M1611 Unilateral primary osteoarthritis, right hip: Secondary | ICD-10-CM | POA: Diagnosis not present

## 2021-06-23 MED ORDER — MELOXICAM 7.5 MG PO TABS
7.5000 mg | ORAL_TABLET | Freq: Every day | ORAL | 0 refills | Status: DC
Start: 2021-06-23 — End: 2021-07-01

## 2021-06-23 NOTE — Progress Notes (Signed)
Subjective:    Patient ID: Rachael Mitchell, female    DOB: 1946/03/14, 75 y.o.   MRN: [id number]  Chief Complaint  Patient presents with   [place] backwards on 10/23, on left buttock. Was in [place], came home on Friday after hours. Feels like it is spreading and in her right groin.  Took some tylenol and ibuprofen alternating. When came home was taking aleve     HPI Patient was seen today for ongoing concern.  Patient was in [facility], helping a friend with her new baby when she tried to step over a baby gate and fell.  Pt's L leg was over the gate when she fell backwards landing on her L buttock.  Pt notes some discomfort, but took Tylenol and aleeve which helped.  Pt returned home over the wknd but noticed pain in R hip and groin, worse with walking.  Tried heat, pillows, Tylenol, ibuprofen, Aleve for symptoms.  Per chart review patient has history of osteopenia on bone density scan 08/05/2020  Past Medical History:  Diagnosis Date   Arthritis    Cancer ([place])    basal cell CA- face and back   Hyperlipidemia    Hypertension    Migraine    Osteopenia    last DEXA 06-09-11   Vitamin D deficiency     No Known Allergies  ROS General: Denies fever, chills, night sweats, changes in weight, changes in appetite HEENT: Denies headaches, ear pain, changes in vision, rhinorrhea, sore throat CV: Denies CP, palpitations, SOB, orthopnea Pulm: Denies SOB, cough, wheezing GI: Denies abdominal pain, nausea, vomiting, diarrhea, constipation GU: Denies dysuria, hematuria, frequency, vaginal discharge Msk: Denies muscle cramps, joint pains  +R groin and hip pain, L buttock pain Neuro: Denies weakness, numbness, tingling Skin: Denies rashes, bruising Psych: Denies depression, anxiety, hallucinations     Objective:    Blood pressure 134/72, pulse (!) 104, temperature 98.6 F (37 C), temperature source Oral, weight 149 lb 9.6 oz (67.9 kg), SpO2 97 %.  Gen. Pleasant, well-nourished,  in no distress, normal affect   HEENT: [place]/AT, face symmetric, conjunctiva clear, no scleral icterus, PERRLA, EOMI, nares patent without drainage Lungs: no accessory muscle use, CTAB, no wheezes or rales Cardiovascular: RRR, no m/r/g, no peripheral edema Musculoskeletal: No TTP of cervical, thoracic, or lumbar spine.  Scoliosis noted.  +log roll, FADIR, FABER of RLE.  TTP of L buttock.  No deformities, no cyanosis or clubbing, normal tone Neuro:  A&Ox3, CN II-XII intact, normal gait Skin:  Warm, no lesions/ rash   Wt Readings from Last 3 Encounters:  06/23/21 149 lb 9.6 oz (67.9 kg)  01/28/21 148 lb (67.1 kg)  12/17/20 146 lb (66.2 kg)    Lab Results  Component Value Date   WBC 7.2 07/30/2020   HGB 13.8 07/30/2020   HCT 41.2 07/30/2020   PLT 435 (H) 07/30/2020   GLUCOSE 103 (H) 07/30/2020   CHOL 150 07/30/2020   TRIG 137 07/30/2020   HDL 49 (L) 07/30/2020   LDLDIRECT 91.4 08/25/2006   LDLCALC 77 07/30/2020   ALT 23 07/30/2020   AST 30 07/30/2020   NA 139 07/30/2020   K 4.9 07/30/2020   CL 103 07/30/2020   CREATININE 0.77 07/30/2020   BUN 17 07/30/2020   CO2 28 07/30/2020   TSH 1.41 07/30/2020   HGBA1C 5.5 07/30/2020    Assessment/Plan:  Right hip pain  -s/p fall backwards -R hip and groin pain worse with walking. -discussed possible  causes including fracture versus sprain -We will obtain imaging. -Mobic as needed with food -Further recommendations based on imaging.  May include PT versus follow-up with Ortho. - Plan: DG Hip Unilat W OR W/O Pelvis Min 4 Views Right, meloxicam (MOBIC) 7.5 MG tablet  Fall from standing, initial encounter -given handout on fall prevention  Osteopenia of necks of both femurs -T score R FN -2.1, LFN -2.2 on 08/05/2020 -Continue optimizing calcium and vitamin D -Follow-up with PCP - Plan: DG Hip Unilat W OR W/O Pelvis Min 4 Views Right  Left buttock pain  -s/p fall backwards -deep bruise vs sprain  -supportive care including  heat, stretching, topical analgesics - Plan: meloxicam (MOBIC) 7.5 MG tablet  F/u prn  Grier Mitts, MD

## 2021-06-25 ENCOUNTER — Telehealth: Payer: Self-pay | Admitting: Family Medicine

## 2021-06-25 ENCOUNTER — Telehealth: Payer: Self-pay | Admitting: Pharmacist

## 2021-06-25 NOTE — Telephone Encounter (Signed)
Please refer patient to result note.

## 2021-06-25 NOTE — Telephone Encounter (Signed)
Pt has viewed her hip xray on mychart and still in pain and would like to know the next step. Pt seen dr banks on 06-23-2021

## 2021-06-25 NOTE — Chronic Care Management (AMB) (Signed)
Chronic Care Management Pharmacy Assistant   Name: Rachael Mitchell  MRN: [id number] DOB: [birth year]  Reason for Encounter: Disease State / Hypertension Assessment Call   Conditions to be addressed/monitored: HTN   Recent office visits:  06/23/2021 Grier Mitts MD (PCP) - Patient was seen for Right hip pain and additional issues. Started Meloxicam 7.5 mg daily. Follow up if symptoms return or fail to improve.  Recent consult visits:  None  Hospital visits:  None  Medications: Outpatient Encounter Medications as of 06/25/2021  Medication Sig   amLODipine (NORVASC) 5 MG tablet TAKE 1 TABLET BY MOUTH EVERY DAY   Ascorbic Acid (VITAMIN C) 500 MG tablet Take 500 mg by mouth 2 (two) times daily.   atorvastatin (LIPITOR) 20 MG tablet Take 1 tablet (20 mg total) by mouth daily.   Calcium Carbonate-Vitamin D 600-400 MG-UNIT tablet Take 1 tablet by mouth 2 (two) times daily.   cholecalciferol (VITAMIN D) 1000 UNITS tablet Take 2,000 Units by mouth daily. Take 2   diclofenac Sodium (VOLTAREN) 1 % GEL Apply 2 g topically 4 (four) times daily.   fenofibrate 160 MG tablet TAKE 1 TABLET BY MOUTH EVERY DAY   fish oil-omega-3 fatty acids 1000 MG capsule Take 1,200 mg by mouth daily.   glucosamine-chondroitin 500-400 MG tablet Take 1 tablet by mouth daily. Takes 1500-1200 mg 1 tablet daily   meloxicam (MOBIC) 7.5 MG tablet Take 1 tablet (7.5 mg total) by mouth daily.   Multiple Vitamin (MULTIVITAMIN) tablet Take 1 tablet by mouth daily.   SUMAtriptan (IMITREX) 100 MG tablet Take 1 tablet (100 mg total) by mouth once as needed.   triamterene-hydrochlorothiazide (MAXZIDE-25) 37.5-25 MG tablet Take 1 tablet by mouth daily.   No facility-administered encounter medications on file as of 06/25/2021.   Fill History: ATORVASTATIN 20 MG TABLET 05/04/2021 90   FENOFIBRATE 160 MG TABLET 06/10/2021 90   MELOXICAM 7.5MG  TAB 06/23/2021 30   TRIAMTERENE-HCTZ 37.5-25 MG TB 06/08/2021 90   AMLODIPINE  BESYLATE 5 MG TAB 03/30/2021 90  Reviewed chart prior to disease state call. Spoke with patient regarding BP  Recent Office Vitals: BP Readings from Last 3 Encounters:  06/23/21 134/72  01/28/21 128/62  12/17/20 (!) 152/70   Pulse Readings from Last 3 Encounters:  06/23/21 (!) 104  01/28/21 76  07/30/20 69    Wt Readings from Last 3 Encounters:  06/23/21 149 lb 9.6 oz (67.9 kg)  01/28/21 148 lb (67.1 kg)  12/17/20 146 lb (66.2 kg)     Kidney Function Lab Results  Component Value Date/Time   CREATININE 0.77 07/30/2020 09:57 AM   CREATININE 0.81 07/30/2019 10:36 AM   CREATININE 0.71 01/16/2019 09:25 AM   GFR 69.07 07/30/2019 10:36 AM   GFRNONAA 76 07/30/2020 09:57 AM   GFRAA 88 07/30/2020 09:57 AM    BMP Latest Ref Rng & Units 07/30/2020 07/30/2019 01/16/2019  Glucose 65 - 99 mg/dL 103(H) 108(H) 92  BUN 7 - 25 mg/dL 17 16 16   Creatinine 0.60 - 0.93 mg/dL 0.77 0.81 0.71  BUN/Creat Ratio 6 - 22 (calc) NOT APPLICABLE - -  Sodium 245 - 146 mmol/L 139 138 137  Potassium 3.5 - 5.3 mmol/L 4.9 3.9 4.6  Chloride 98 - 110 mmol/L 103 102 100  CO2 20 - 32 mmol/L 28 29 27   Calcium 8.6 - 10.4 mg/dL 10.5(H) 10.1 10.1    Current antihypertensive regimen:  Amlodipine 5 mg daily Maxzide 37.5/25 mg daily  How often are you checking your Blood  Pressure? Patient has not been checking blood pressures.  Current home BP readings: None  What recent interventions/DTPs have been made by any provider to improve Blood Pressure control since last CPP Visit: None  Any recent hospitalizations or ED visits since last visit with CPP? No  What diet changes have been made to improve Blood Pressure Control?  Patient will have cereal or eggs for breakfast, 1/2 sanwich, salad or soup for lunch and a meat, vegetable and starch for dinner.   What exercise is being done to improve your Blood Pressure Control?   Patient does water classes two on Tuesdays and thursdays. Patient also does work at  home.  Adherence Review: Is the patient currently on ACE/ARB medication? No Does the patient have >5 day gap between last estimated fill dates? No  Note:  Refills show Simvastatin and Atorvastatin recently  filled, verified with patient that she is taking Atorvastatin and not Simvastatin.  Care Gaps: AWV - message sent to Ramond Craver to schedule Last BP - 134/72 on 06/23/2021 Flu - due  Star Rating Drugs: Atorvastatin 20 mg - last filled 06/03/2021 90 DS at [facility] Pharmacist Assistant [phone]

## 2021-06-29 ENCOUNTER — Telehealth: Payer: Self-pay | Admitting: Family Medicine

## 2021-06-29 ENCOUNTER — Other Ambulatory Visit: Payer: Self-pay | Admitting: Family Medicine

## 2021-06-29 NOTE — Telephone Encounter (Signed)
error 

## 2021-07-01 ENCOUNTER — Ambulatory Visit (INDEPENDENT_AMBULATORY_CARE_PROVIDER_SITE_OTHER): Payer: Medicare Other

## 2021-07-01 ENCOUNTER — Encounter: Payer: Self-pay | Admitting: Family Medicine

## 2021-07-01 ENCOUNTER — Other Ambulatory Visit: Payer: Self-pay

## 2021-07-01 ENCOUNTER — Ambulatory Visit (INDEPENDENT_AMBULATORY_CARE_PROVIDER_SITE_OTHER): Payer: Medicare Other | Admitting: Family Medicine

## 2021-07-01 VITALS — BP 132/70 | HR 72 | Temp 98.0°F | Wt 148.0 lb

## 2021-07-01 DIAGNOSIS — M545 Low back pain, unspecified: Secondary | ICD-10-CM | POA: Diagnosis not present

## 2021-07-01 DIAGNOSIS — R102 Pelvic and perineal pain: Secondary | ICD-10-CM

## 2021-07-01 MED ORDER — HYDROCODONE-ACETAMINOPHEN 5-325 MG PO TABS: 1.0000 | ORAL_TABLET | ORAL | 0 refills | Status: AC | PRN

## 2021-07-01 MED ORDER — MELOXICAM 15 MG PO TABS: 15.0000 mg | ORAL_TABLET | Freq: Every day | ORAL | 2 refills | Status: DC

## 2021-07-01 NOTE — Progress Notes (Signed)
   Subjective:    Patient ID: Rachael Mitchell, female    DOB: 1946/04/09, 75 y.o.   MRN: [id number]  HPI Here to follow up on injuries from a fall on 06-13-21. As she was stepping over a baby gate, she slipped and fell on her left buttock. Since then she has had severe pain in the right posterior hip area, in the lower back, and across the pubic areas. She was seen here on 06-23-21 and had Xrays taken of the right hip. These were negative for fractures. She was started on Meloxicam 7.5 mg daily and she has ben supplementing this with Tylenol. The pain has not improved at all. She is using a walker to get around. Her BMs and urinations are normal.    Review of Systems  Constitutional: Negative.   Respiratory: Negative.    Cardiovascular: Negative.   Genitourinary: Negative.   Musculoskeletal:  Positive for arthralgias and back pain.      Objective:   Physical Exam Constitutional:      Comments: She can stand unassisted, but she cannot walk unassisted due to pain   Cardiovascular:     Rate and Rhythm: Normal rate and regular rhythm.     Pulses: Normal pulses.     Heart sounds: Normal heart sounds.  Pulmonary:     Effort: Pulmonary effort is normal.     Breath sounds: Normal breath sounds.  Abdominal:     General: Abdomen is flat. Bowel sounds are normal. There is no distension.     Palpations: Abdomen is soft. There is no mass.     Tenderness: There is no abdominal tenderness. There is no guarding or rebound.     Hernia: No hernia is present.  Musculoskeletal:     Comments: She is mildly tender over the right lower back and across the entire pubic area. Both hips show full ROM  Neurological:     Mental Status: She is alert.          Assessment & Plan:  She has pain after a fall, and I am concerned about possible fractures to the lumbar spine or the pelvis. We will send her for Xrays of these areas. Increase the Meloxicam to 15 mg daily and she can add Norco 5-325 as needed.   Alysia Penna, MD

## 2021-07-02 LAB — URINALYSIS
Bilirubin Urine: NEGATIVE
Hgb urine dipstick: NEGATIVE
Ketones, ur: NEGATIVE
Leukocytes,Ua: NEGATIVE
Nitrite: NEGATIVE
Specific Gravity, Urine: 1.01 (ref 1.000–1.030)
Total Protein, Urine: NEGATIVE
Urine Glucose: NEGATIVE
Urobilinogen, UA: 0.2 (ref 0.0–1.0)
pH: 7 (ref 5.0–8.0)

## 2021-07-05 ENCOUNTER — Telehealth: Payer: Self-pay | Admitting: Family Medicine

## 2021-07-05 NOTE — Telephone Encounter (Signed)
Pt is calling and has viewed the results on mychart and would like someone to go over the results

## 2021-07-05 NOTE — Telephone Encounter (Signed)
Result message has not been placed by Dr. Sarajane Jews.   Will call patient once results message has been placed

## 2021-07-06 ENCOUNTER — Encounter: Payer: Self-pay | Admitting: Family Medicine

## 2021-07-06 DIAGNOSIS — M545 Low back pain, unspecified: Secondary | ICD-10-CM

## 2021-07-06 DIAGNOSIS — R102 Pelvic and perineal pain: Secondary | ICD-10-CM

## 2021-07-06 NOTE — Telephone Encounter (Signed)
This is more of an orthopedic problem, so I have referred her to Orthopedics

## 2021-07-06 NOTE — Telephone Encounter (Addendum)
X-ray results has been discussed with patient.    Patient asked for UA results from 07/01/21, waiting for result message.  Patient anxious for reply.   Please advise

## 2021-07-06 NOTE — Telephone Encounter (Signed)
Patient called again to follow up on results from Xray last Thursday.    Good callback number is [phone]     Please advise

## 2021-07-07 NOTE — Telephone Encounter (Signed)
Tell her the UA was clear. Also the Xrays showed arthritis in most of the joints. I think she would benefit from seeing Orthopedics. I can do a referral if she wishes

## 2021-07-08 ENCOUNTER — Ambulatory Visit (INDEPENDENT_AMBULATORY_CARE_PROVIDER_SITE_OTHER): Payer: Medicare Other | Admitting: Orthopaedic Surgery

## 2021-07-08 ENCOUNTER — Other Ambulatory Visit: Payer: Self-pay

## 2021-07-08 ENCOUNTER — Encounter: Payer: Self-pay | Admitting: Orthopaedic Surgery

## 2021-07-08 DIAGNOSIS — M545 Low back pain, unspecified: Secondary | ICD-10-CM

## 2021-07-08 DIAGNOSIS — G8929 Other chronic pain: Secondary | ICD-10-CM

## 2021-07-08 NOTE — Progress Notes (Signed)
Office Visit Note   Patient: Rachael Mitchell           Date of Birth: 03/31/1946           MRN: [id number] Visit Date: 07/08/2021              Requested by: Laurey Morale, MD [place],  [place] [id number] PCP: Laurey Morale, MD   Assessment & Plan: Visit Diagnoses: No diagnosis found.  Plan: Pleasant 75 year old woman with a 4-week history of lower back pain with radiation into her bilateral lower extremities.  She denies any loss of bowel or bladder control.  On the left side this radiates down her posterior left leg on the right side radiates down her right lateral leg.  Because of pain and she is afraid she is going to fall she is resorted to using a walker.  Previously she was ambulating without any assistive devices.  X-rays her spine demonstrate them at degenerative disc disease and facet arthritis throughout her lower spine.  She also has calcification of her aorta without dilatation.  She also has slight scoliosis to the right.  She is now 4 weeks since her injury and has had progressive symptoms of pain and weakness.  Initially she did not use any assistive devices went to a cane and now is using a walker.  She has tried meloxicam on a regular basis and takes Vicodin when her pain becomes unbearable.  She is have pain waking her at night and cannot do her normal activities of daily living.  We will go forward with an MRI to see if she may be helped with an epidural steroid injection  Follow-Up Instructions: No follow-ups on file.   Orders:  No orders of the defined types were placed in this encounter.  No orders of the defined types were placed in this encounter.     Procedures: No procedures performed   Clinical Data: No additional findings.   Subjective: Chief Complaint  Patient presents with   Lower Back - Pain  Patient presents today for lower back pain. She said that she fell on her left buttock 06/13/2021. She was out of town and was able to walk  and get around with a limp. Since then she has continued to worsen. She now has to walk with a walker. She has been seen her PCP and has had x-rays. She has pain across her back and radiates down both legs to the level of her knees. Her pain is posteriorly in the left leg, and laterally in the right leg. She is taking meloxicam, vicodin, and tylenol.  No numbness or tingling.     Review of Systems  All other systems reviewed and are negative.   Objective: Vital Signs: There were no vitals taken for this visit.  Physical Exam Constitutional:      Appearance: Normal appearance.  Pulmonary:     Effort: Pulmonary effort is normal.  Skin:    General: Skin is warm and dry.  Neurological:     Mental Status: She is alert.  Psychiatric:        Mood and Affect: Mood normal.        Behavior: Behavior normal.    Ortho Exam Examination of her lower extremities.  She has good range of motion and minimal pain with internal and external rotation of her bilateral hips.  Arc of motion is equivalent.  She is ambulating very gingerly with her walker and was afraid  not to use it because she is afraid she will fall.  She does have some tenderness to palpation over the lower back.  Straight leg raising on the right reproduces the symptoms in her right lateral leg.  Straight leg raising on the left produces some of the symptoms in her posterior leg.  Her strength is equivalent with dorsiflexion plantarflexion resisted extension and flexion at 4 out of 5. Specialty Comments:  No specialty comments available.  Imaging: No results found.   PMFS History: Patient Active Problem List   Diagnosis Date Noted   Hyperglycemia 12/19/2016   Osteoarthritis 12/10/2012   Migraine 06/07/2010   Vitamin D deficiency 12/01/2008   ALLERGIC RHINITIS 12/01/2008   SKIN CANCER, HX OF 05/28/2007   Hyperlipidemia 04/17/2007   Essential hypertension 04/17/2007   Disorder of bone and cartilage 04/17/2007   Past Medical  History:  Diagnosis Date   Arthritis    Cancer ([place])    basal cell CA- face and back   Hyperlipidemia    Hypertension    Migraine    Osteopenia    last DEXA 06-09-11   Vitamin D deficiency     Family History  Problem Relation Age of Onset   Heart failure Brother    Diabetes Father    Arthritis Other    Coronary artery disease Other    Hypertension Other    Lung cancer Other    Osteoporosis Other    Heart failure Other    Colon cancer Neg Hx    Esophageal cancer Neg Hx    Stomach cancer Neg Hx    Rectal cancer Neg Hx     Past Surgical History:  Procedure Laterality Date   BASAL CELL CARCINOMA EXCISION     face back   CATARACT EXTRACTION, BILATERAL     colonoscopy  11/27/2017   per Dr. Hilarie Fredrickson, benign polyps, repeat in 5 yrs (hx of adenomas)    polpectomy  2007   colon-in [place].   POLYPECTOMY     Social History   Occupational History   Not on file  Tobacco Use   Smoking status: Former    Types: Cigarettes    Quit date: 09/24/1992    Years since quitting: 28.8   Smokeless tobacco: Never  Vaping Use   Vaping Use: Never used  Substance and Sexual Activity   Alcohol use: Yes    Alcohol/week: 3.0 standard drinks    Types: 3 Standard drinks or equivalent per week    Comment: 3 glasses of wine a week   Drug use: No   Sexual activity: Not on file

## 2021-07-08 NOTE — Telephone Encounter (Signed)
Spoke with pt advised of Dr Sarajane Jews advise, verbalized understanding, pt state that she has appointment this afternoon with the Orthopedics

## 2021-07-20 ENCOUNTER — Other Ambulatory Visit: Payer: Self-pay | Admitting: Family Medicine

## 2021-07-20 DIAGNOSIS — M7918 Myalgia, other site: Secondary | ICD-10-CM

## 2021-07-20 DIAGNOSIS — M25551 Pain in right hip: Secondary | ICD-10-CM

## 2021-08-01 ENCOUNTER — Other Ambulatory Visit: Payer: Self-pay

## 2021-08-01 ENCOUNTER — Ambulatory Visit
Admission: RE | Admit: 2021-08-01 | Discharge: 2021-08-01 | Disposition: A | Discharge status: Home / Self Care | Payer: Medicare Other | Source: Ambulatory Visit | Attending: Orthopaedic Surgery | Admitting: Orthopaedic Surgery

## 2021-08-01 DIAGNOSIS — M2578 Osteophyte, vertebrae: Secondary | ICD-10-CM | POA: Diagnosis not present

## 2021-08-01 DIAGNOSIS — M79604 Pain in right leg: Secondary | ICD-10-CM | POA: Diagnosis not present

## 2021-08-01 DIAGNOSIS — M545 Low back pain, unspecified: Secondary | ICD-10-CM

## 2021-08-01 DIAGNOSIS — M47816 Spondylosis without myelopathy or radiculopathy, lumbar region: Secondary | ICD-10-CM | POA: Diagnosis not present

## 2021-08-01 DIAGNOSIS — M48061 Spinal stenosis, lumbar region without neurogenic claudication: Secondary | ICD-10-CM | POA: Diagnosis not present

## 2021-08-02 ENCOUNTER — Ambulatory Visit (INDEPENDENT_AMBULATORY_CARE_PROVIDER_SITE_OTHER): Payer: Medicare Other | Admitting: Family Medicine

## 2021-08-02 ENCOUNTER — Encounter: Payer: Self-pay | Admitting: Family Medicine

## 2021-08-02 VITALS — BP 132/78 | HR 73 | Temp 98.5°F | Ht 65.5 in | Wt 144.4 lb

## 2021-08-02 DIAGNOSIS — E559 Vitamin D deficiency, unspecified: Secondary | ICD-10-CM

## 2021-08-02 DIAGNOSIS — R7989 Other specified abnormal findings of blood chemistry: Secondary | ICD-10-CM

## 2021-08-02 DIAGNOSIS — E782 Mixed hyperlipidemia: Secondary | ICD-10-CM | POA: Diagnosis not present

## 2021-08-02 DIAGNOSIS — G43009 Migraine without aura, not intractable, without status migrainosus: Secondary | ICD-10-CM

## 2021-08-02 DIAGNOSIS — M159 Polyosteoarthritis, unspecified: Secondary | ICD-10-CM

## 2021-08-02 DIAGNOSIS — R739 Hyperglycemia, unspecified: Secondary | ICD-10-CM

## 2021-08-02 DIAGNOSIS — I10 Essential (primary) hypertension: Secondary | ICD-10-CM | POA: Diagnosis not present

## 2021-08-02 LAB — CBC WITH DIFFERENTIAL/PLATELET
Basophils Absolute: 0.1 10*3/uL (ref 0.0–0.1)
Basophils Relative: 1 % (ref 0.0–3.0)
Eosinophils Absolute: 0.2 10*3/uL (ref 0.0–0.7)
Eosinophils Relative: 2.3 % (ref 0.0–5.0)
HCT: 35.1 % — ABNORMAL LOW (ref 36.0–46.0)
Hemoglobin: 12.1 g/dL (ref 12.0–15.0)
Lymphocytes Relative: 33.2 % (ref 12.0–46.0)
Lymphs Abs: 2.6 10*3/uL (ref 0.7–4.0)
MCHC: 34.5 g/dL (ref 30.0–36.0)
MCV: 88.1 fl (ref 78.0–100.0)
Monocytes Absolute: 0.6 10*3/uL (ref 0.1–1.0)
Monocytes Relative: 7.8 % (ref 3.0–12.0)
Neutro Abs: 4.4 10*3/uL (ref 1.4–7.7)
Neutrophils Relative %: 55.7 % (ref 43.0–77.0)
Platelets: 527 10*3/uL — ABNORMAL HIGH (ref 150.0–400.0)
RBC: 3.98 Mil/uL (ref 3.87–5.11)
RDW: 12.2 % (ref 11.5–15.5)
WBC: 7.9 10*3/uL (ref 4.0–10.5)

## 2021-08-02 LAB — HEPATIC FUNCTION PANEL
ALT: 11 U/L (ref 0–35)
AST: 17 U/L (ref 0–37)
Albumin: 4.3 g/dL (ref 3.5–5.2)
Alkaline Phosphatase: 100 U/L (ref 39–117)
Bilirubin, Direct: 0.1 mg/dL (ref 0.0–0.3)
Total Bilirubin: 0.4 mg/dL (ref 0.2–1.2)
Total Protein: 7.4 g/dL (ref 6.0–8.3)

## 2021-08-02 LAB — LIPID PANEL
Cholesterol: 123 mg/dL (ref 0–200)
HDL: 48.1 mg/dL (ref >39.00)
LDL Cholesterol: 49 mg/dL (ref 0–99)
NonHDL: 74.83
Total CHOL/HDL Ratio: 3
Triglycerides: 127 mg/dL (ref 0.0–149.0)
VLDL: 25.4 mg/dL (ref 0.0–40.0)

## 2021-08-02 LAB — BASIC METABOLIC PANEL
BUN: 18 mg/dL (ref 6–23)
CO2: 26 mEq/L (ref 19–32)
Calcium: 10.2 mg/dL (ref 8.4–10.5)
Chloride: 97 mEq/L (ref 96–112)
Creatinine, Ser: 0.69 mg/dL (ref 0.40–1.20)
GFR: 84.38 mL/min (ref >60.00)
Glucose, Bld: 99 mg/dL (ref 70–99)
Potassium: 4.3 mEq/L (ref 3.5–5.1)
Sodium: 133 mEq/L — ABNORMAL LOW (ref 135–145)

## 2021-08-02 LAB — HEMOGLOBIN A1C: Hgb A1c MFr Bld: 5.7 % (ref 4.6–6.5)

## 2021-08-02 LAB — TSH: TSH: 1.25 u[IU]/mL (ref 0.35–5.50)

## 2021-08-02 LAB — VITAMIN D 25 HYDROXY (VIT D DEFICIENCY, FRACTURES): VITD: 52.49 ng/mL (ref 30.00–100.00)

## 2021-08-02 MED ORDER — TRIAMTERENE-HCTZ 37.5-25 MG PO TABS: 1.0000 | ORAL_TABLET | Freq: Every day | ORAL | 3 refills | Status: DC

## 2021-08-02 MED ORDER — SUMATRIPTAN SUCCINATE 100 MG PO TABS: 100.0000 mg | ORAL_TABLET | Freq: Once | ORAL | 3 refills | Status: DC | PRN

## 2021-08-02 MED ORDER — DICLOFENAC SODIUM 1 % EX GEL: 2.0000 g | Freq: Four times a day (QID) | CUTANEOUS | 5 refills | Status: DC

## 2021-08-02 NOTE — Progress Notes (Signed)
Subjective:    Patient ID: Rachael Mitchell, female    DOB: 1945-11-07, 75 y.o.   MRN: [id number]  HPI Here to follow up on issues. She saw [place] and then Dr. Joni Fears for low back pain after a fall on 06-13-21. He ordered an MRI which was completed yesterday, but the report is not available yet. She is scheduled to see Dr. Lorin Mercy to go over this on 08-04-21. He pain has improved a little since we saw her. She has stopped the Vicodin and she is taking Tylenol instead. Her migraines are infrequent, averaging 3 a month. Her OA is stable.    Review of Systems  Constitutional: Negative.   HENT: Negative.    Eyes: Negative.   Respiratory: Negative.    Cardiovascular: Negative.   Gastrointestinal: Negative.   Genitourinary:  Negative for decreased urine volume, difficulty urinating, dyspareunia, dysuria, enuresis, flank pain, frequency, hematuria, pelvic pain and urgency.  Musculoskeletal:  Positive for back pain.  Skin: Negative.   Neurological: Negative.  Negative for headaches.  Psychiatric/Behavioral: Negative.        Objective:   Physical Exam Constitutional:      General: She is not in acute distress.    Appearance: She is well-developed.     Comments: Walks with a walker   HENT:     Head: Normocephalic and atraumatic.     Right Ear: External ear normal.     Left Ear: External ear normal.     Nose: Nose normal.     Mouth/Throat:     Pharynx: No oropharyngeal exudate.  Eyes:     General: No scleral icterus.    Conjunctiva/sclera: Conjunctivae normal.     Pupils: Pupils are equal, round, and reactive to light.  Neck:     Thyroid: No thyromegaly.     Vascular: No JVD.  Cardiovascular:     Rate and Rhythm: Normal rate and regular rhythm.     Heart sounds: Normal heart sounds. No murmur heard.   No friction rub. No gallop.  Pulmonary:     Effort: Pulmonary effort is normal. No respiratory distress.     Breath sounds: Normal breath sounds. No wheezing or rales.  Chest:      Chest wall: No tenderness.  Abdominal:     General: Bowel sounds are normal. There is no distension.     Palpations: Abdomen is soft. There is no mass.     Tenderness: There is no abdominal tenderness. There is no guarding or rebound.  Musculoskeletal:        General: No tenderness. Normal range of motion.     Cervical back: Normal range of motion and neck supple.  Lymphadenopathy:     Cervical: No cervical adenopathy.  Skin:    General: Skin is warm and dry.     Findings: No erythema or rash.  Neurological:     Mental Status: She is alert and oriented to person, place, and time.     Cranial Nerves: No cranial nerve deficit.     Motor: No abnormal muscle tone.     Coordination: Coordination normal.     Deep Tendon Reflexes: Reflexes are normal and symmetric. Reflexes normal.  Psychiatric:        Behavior: Behavior normal.        Thought Content: Thought content normal.        Judgment: Judgment normal.          Assessment & Plan:  Her low back pain is still  an issue, and she will see Dr. Lorin Mercy in 2 days to go over the MRI results and to see what the next stwp may be. Her OA and migraines are stable. Her HTN is well controlled. We will get fasting labs to check lipids, vitamin D level, etc.  We spent a total of ( 35  ) minutes reviewing records and discussing these issues.  Alysia Penna, MD

## 2021-08-03 ENCOUNTER — Telehealth: Payer: Self-pay | Admitting: Orthopaedic Surgery

## 2021-08-03 NOTE — Telephone Encounter (Signed)
Spoke with patient already concerning voicemail I left for her.

## 2021-08-03 NOTE — Telephone Encounter (Signed)
Pt called and states Lauren has called her. She would like lauren to return her call.   CB [phone]

## 2021-08-03 NOTE — Addendum Note (Signed)
Addended by: Nilda Riggs on: 08/03/2021 11:43 AM   Modules accepted: Orders

## 2021-08-04 ENCOUNTER — Ambulatory Visit (INDEPENDENT_AMBULATORY_CARE_PROVIDER_SITE_OTHER): Payer: Medicare Other | Admitting: Orthopaedic Surgery

## 2021-08-04 ENCOUNTER — Other Ambulatory Visit: Payer: Self-pay

## 2021-08-04 ENCOUNTER — Encounter: Payer: Self-pay | Admitting: Orthopaedic Surgery

## 2021-08-04 DIAGNOSIS — S3210XA Unspecified fracture of sacrum, initial encounter for closed fracture: Secondary | ICD-10-CM | POA: Diagnosis not present

## 2021-08-04 NOTE — Progress Notes (Signed)
Office Visit Note   Patient: Rachael Mitchell           Date of Birth: August 19, 1946           MRN: [id number] Visit Date: 08/04/2021              Requested by: Laurey Morale, MD [place],  [place] [address] PCP: Laurey Morale, MD   Assessment & Plan: Visit Diagnoses:  1. Closed fracture of sacrum, unspecified portion of sacrum, initial encounter ([place])     Plan: We discussed she should progressively improve with time continue walker.  She uses a cane some in the house.  I will check her back in a month.  We can obtain 2 view sacral x-rays on return.  Follow-Up Instructions: No follow-ups on file.   Orders:  No orders of the defined types were placed in this encounter.  No orders of the defined types were placed in this encounter.     Procedures:     Clinical Data: No additional findings.   Subjective: Chief Complaint  Patient presents with   Lower Back - Pain, Follow-up    MRI lumbar spine review    HPI 75 year old female was visiting helping with childcare with her friend who lives in [place] was trying to step over a gate for the child and that she sort of tried to hop over it she slipped fell backwards and landed on her sacrum.  She had pain and ultimately had x-rays followed by MRI which was read as sacral insufficiency fracture.  Exam and history consistent with a nondisplaced sacral fracture.  Patient used ice heat rolling walker with reverse seat.  She has had more discomfort in the right than left side.  She had some hydrocodone only taken a couple.  She has increased pain with increased activity she gets better with rest she thinks each week she has gotten better.  Date of fall was 06/13/2021.  Review of Systems all other systems are noncontributory to HPI.   Objective: Vital Signs: BP (!) 157/66    Pulse 82    Ht 5' 5.5" (1.664 m)    Wt 144 lb (65.3 kg)    BMI 23.60 kg/m   Physical Exam Constitutional:      Appearance: She is  well-developed.  HENT:     Head: Normocephalic.     Right Ear: External ear normal.     Left Ear: External ear normal. There is no impacted cerumen.  Eyes:     Pupils: Pupils are equal, round, and reactive to light.  Neck:     Thyroid: No thyromegaly.     Trachea: No tracheal deviation.  Cardiovascular:     Rate and Rhythm: Normal rate.  Pulmonary:     Effort: Pulmonary effort is normal.  Abdominal:     Palpations: Abdomen is soft.  Musculoskeletal:     Cervical back: No rigidity.  Skin:    General: Skin is warm and dry.  Neurological:     Mental Status: She is alert and oriented to person, place, and time.  Psychiatric:        Behavior: Behavior normal.    Ortho Exam patient has tenderness over the sacrum.  She does not have any ecchymosis.  Anterior tib gastrocsoleus is intact.  Specialty Comments:  No specialty comments available.  Imaging: Narrative & Impression  CLINICAL DATA:  Low back, bilateral hip and right leg pain with weakness since 06/22/2021. Patient reports a  fall, 06/13/2021.   EXAM: MRI LUMBAR SPINE WITHOUT CONTRAST   TECHNIQUE: Multiplanar, multisequence MR imaging of the lumbar spine was performed. No intravenous contrast was administered.   COMPARISON:  X-ray lumbar 07/01/2021.   FINDINGS: Segmentation:  Standard.   Alignment:  2 mm retrolisthesis of L1 on L2.   Vertebrae: No acute fracture, evidence of discitis, or aggressive bone lesion. Bilateral sacral insufficiency fractures with associated bone marrow edema.   Conus medullaris and cauda equina: Conus extends to the T12 level. Conus and cauda equina appear normal.   Paraspinal and other soft tissues: No acute paraspinal abnormality.   Disc levels:   Disc spaces: Degenerative disease with disc height loss throughout the thoracolumbar spine.   On the sagittal images there are mild broad-based disc bulges at T9-10, T10-11 and T11-12 without foraminal or central canal stenosis.    T12-L1: Broad-based disc bulge. No foraminal or central canal stenosis.   L1-L2: Broad-based disc osteophyte complex flattening the ventral thecal sac. Mild bilateral facet arthropathy. No central canal stenosis. Moderate-severe bilateral foraminal stenosis.   L2-L3: Mild broad-based disc bulge. Mild bilateral facet arthropathy. No right foraminal stenosis. Mild left foraminal stenosis. No central canal stenosis.   L3-L4: Broad-based disc bulge flattening ventral thecal sac. Mild bilateral facet arthropathy. Mild spinal stenosis. Mild bilateral foraminal stenosis.   L4-L5: Mild broad-based disc bulge. No foraminal or central canal stenosis.   L5-S1: Broad-based disc osteophyte complex. Mild right facet arthropathy. Moderate right foraminal stenosis. No left foraminal stenosis.   IMPRESSION: 1. Nondisplaced bilateral sacral insufficiency fractures with associated marrow edema. 2. Diffuse lumbar spine spondylosis as described above. 3.  No acute osseous injury of the lumbar spine.     Electronically Signed   By: Kathreen Devoid M.D.   On: 08/02/2021 10:12       PMFS History:  Patient Active Problem List   Diagnosis Date Noted   Sacral fracture, closed ([place]) 08/04/2021   Hyperglycemia 12/19/2016   Osteoarthritis 12/10/2012   Migraine 06/07/2010   Vitamin D deficiency 12/01/2008   ALLERGIC RHINITIS 12/01/2008   SKIN CANCER, HX OF 05/28/2007   Hyperlipidemia 04/17/2007   Essential hypertension 04/17/2007   Disorder of bone and cartilage 04/17/2007   Past Medical History:  Diagnosis Date   Arthritis    Cancer ([place])    basal cell CA- face and back   Hyperlipidemia    Hypertension    Migraine    Osteopenia    last DEXA 06-09-11   Vitamin D deficiency     Family History  Problem Relation Age of Onset   Heart failure Brother    Diabetes Father    Arthritis Other    Coronary artery disease Other    Hypertension Other    Lung cancer Other    Osteoporosis Other     Heart failure Other    Colon cancer Neg Hx    Esophageal cancer Neg Hx    Stomach cancer Neg Hx    Rectal cancer Neg Hx     Past Surgical History:  Procedure Laterality Date   BASAL CELL CARCINOMA EXCISION     face back   CATARACT EXTRACTION, BILATERAL     colonoscopy  11/27/2017   per Dr. Hilarie Fredrickson, benign polyps, repeat in 5 yrs (hx of adenomas)    polpectomy  2007   colon-in [place].   POLYPECTOMY     Social History   Occupational History   Not on file  Tobacco Use   Smoking status: Former  Types: Cigarettes    Quit date: 09/24/1992    Years since quitting: 28.8   Smokeless tobacco: Never  Vaping Use   Vaping Use: Never used  Substance and Sexual Activity   Alcohol use: Yes    Alcohol/week: 3.0 standard drinks    Types: 3 Standard drinks or equivalent per week    Comment: 3 glasses of wine a week   Drug use: No   Sexual activity: Not on file

## 2021-09-01 ENCOUNTER — Ambulatory Visit (INDEPENDENT_AMBULATORY_CARE_PROVIDER_SITE_OTHER): Payer: Medicare Other

## 2021-09-01 ENCOUNTER — Other Ambulatory Visit: Payer: Self-pay

## 2021-09-01 ENCOUNTER — Ambulatory Visit (INDEPENDENT_AMBULATORY_CARE_PROVIDER_SITE_OTHER): Payer: Medicare Other | Admitting: Orthopaedic Surgery

## 2021-09-01 ENCOUNTER — Encounter: Payer: Self-pay | Admitting: Orthopaedic Surgery

## 2021-09-01 VITALS — BP 158/77 | HR 80 | Ht 65.5 in | Wt 144.0 lb

## 2021-09-01 DIAGNOSIS — S3210XA Unspecified fracture of sacrum, initial encounter for closed fracture: Secondary | ICD-10-CM

## 2021-09-01 NOTE — Progress Notes (Signed)
Office Visit Note   Patient: Rachael Mitchell           Date of Birth: [birth year]           MRN: [id number] Visit Date: 09/01/2021              Requested by: Laurey Morale, MD [place],  [place] [address] PCP: Laurey Morale, MD   Assessment & Plan: Visit Diagnoses:  1. Closed fracture of sacrum, unspecified portion of sacrum, initial encounter ([place])     Plan: Return 1 month.  2 view x-ray sacrum on return AP and lateral.  Follow-Up Instructions: Return in about 1 month (around 10/02/2021).   Orders:  Orders Placed This Encounter  Procedures   XR Sacrum/Coccyx   No orders of the defined types were placed in this encounter.     Procedures: No procedures performed   Clinical Data: No additional findings.   Subjective: Chief Complaint  Patient presents with   Pelvis - Follow-up, Pain    HPI 76 year old female returns for follow-up of sacral fracture that occurred with a fall on 06/13/2021 landing on her buttocks.  She is continue to have some pain in her right groin with walking.  She is able to ambulate short distance without the rolling walker.  MRI scan ordered 08/02/2021 showed the nondisplaced sacral insufficiency fracture with marrow edema.  Review of Systems positive for bilateral hip osteoarthritis.  Osteopenia by bone density 2022.   Objective: Vital Signs: BP (!) 158/77    Pulse 80    Ht 5' 5.5" (1.664 m)    Wt 144 lb (65.3 kg)    BMI 23.60 kg/m   Physical Exam Constitutional:      Appearance: She is well-developed.  HENT:     Head: Normocephalic.     Right Ear: External ear normal.     Left Ear: External ear normal. There is no impacted cerumen.  Eyes:     Pupils: Pupils are equal, round, and reactive to light.  Neck:     Thyroid: No thyromegaly.     Trachea: No tracheal deviation.  Cardiovascular:     Rate and Rhythm: Normal rate.  Pulmonary:     Effort: Pulmonary effort is normal.  Abdominal:     Palpations: Abdomen is  soft.  Musculoskeletal:     Cervical back: No rigidity.  Skin:    General: Skin is warm and dry.  Neurological:     Mental Status: She is alert and oriented to person, place, and time.  Psychiatric:        Behavior: Behavior normal.    Ortho Exam minimal discomfort with internal rotation right left hip with mild limitation.  Internal rotation right hip does not reproduce her groin pain.  No groin pain with hip flexion extension against resistance.  Some tenderness palpation over the mid sacral region.  Specialty Comments:  No specialty comments available.  Imaging: XR Sacrum/Coccyx  Result Date: 09/01/2021 2 view x-rays sacrum and coccyx obtained and reviewed.  On lateral view there is some sclerotic changes consistent with interval healing of a sacral fracture.  Bowel gas obscures AP images.  Comparison to 05/31/2021 images shows the slight displacement of the transverse sacral fracture. Impression: Sacral fracture minimally displaced with interval healing.    PMFS History: Patient Active Problem List   Diagnosis Date Noted   Sacral fracture, closed ([place]) 08/04/2021   Hyperglycemia 12/19/2016   Osteoarthritis 12/10/2012   Migraine 06/07/2010  Vitamin D deficiency 12/01/2008   ALLERGIC RHINITIS 12/01/2008   SKIN CANCER, HX OF 05/28/2007   Hyperlipidemia 04/17/2007   Essential hypertension 04/17/2007   Disorder of bone and cartilage 04/17/2007   Past Medical History:  Diagnosis Date   Arthritis    Cancer ([place])    basal cell CA- face and back   Hyperlipidemia    Hypertension    Migraine    Osteopenia    last DEXA 06-09-11   Vitamin D deficiency     Family History  Problem Relation Age of Onset   Heart failure Brother    Diabetes Father    Arthritis Other    Coronary artery disease Other    Hypertension Other    Lung cancer Other    Osteoporosis Other    Heart failure Other    Colon cancer Neg Hx    Esophageal cancer Neg Hx    Stomach cancer Neg Hx    Rectal  cancer Neg Hx     Past Surgical History:  Procedure Laterality Date   BASAL CELL CARCINOMA EXCISION     face back   CATARACT EXTRACTION, BILATERAL     colonoscopy  11/27/2017   per Dr. Hilarie Fredrickson, benign polyps, repeat in 5 yrs (hx of adenomas)    polpectomy  2007   colon-in [place].   POLYPECTOMY     Social History   Occupational History   Not on file  Tobacco Use   Smoking status: Former    Types: Cigarettes    Quit date: 09/24/1992    Years since quitting: 28.9   Smokeless tobacco: Never  Vaping Use   Vaping Use: Never used  Substance and Sexual Activity   Alcohol use: Yes    Alcohol/week: 3.0 standard drinks    Types: 3 Standard drinks or equivalent per week    Comment: 3 glasses of wine a week   Drug use: No   Sexual activity: Not on file

## 2021-09-02 ENCOUNTER — Telehealth: Payer: Self-pay | Admitting: Pharmacist

## 2021-09-02 NOTE — Chronic Care Management (AMB) (Signed)
° ° °  Chronic Care Management Pharmacy Assistant   Name: Graycee Greeson  MRN: [id number] DOB: 22-Nov-1945  09/07/2021 APPOINTMENT REMINDER  Nalayah Reilly was reminded to have all medications, supplements and any blood glucose and blood pressure readings available for review with Jeni Salles, Pharm. D, at her telephone visit on 09/07/2021 at 1:00.   Questions: Have you had any recent office visit or specialist visit outside of [facility]? Patient denies any visits outside of [facility]  Are there any concerns you would like to discuss during your office visit? Patient denies any concerns.   Are you having any problems obtaining your medications? (Whether it pharmacy issues or cost) Patient denies any issues getting medications  If patient has any PAP medications ask if they are having any problems getting their PAP medication or refill? Patient denies any medications being filled by PAP.   Care Gaps: AWV - message sent to Ramond Craver to schedule Last BP - 158/77 on 09/01/2021 Flu - due  Star Rating Drug: Atorvastatin 20 mg - last filled 06/03/2021 90 DS at [facility]  Any gaps in medications fill history? No  Gennie Alma [facility]  Catering manager [phone]

## 2021-09-07 ENCOUNTER — Ambulatory Visit (INDEPENDENT_AMBULATORY_CARE_PROVIDER_SITE_OTHER): Payer: Medicare Other | Admitting: Pharmacist

## 2021-09-07 DIAGNOSIS — I10 Essential (primary) hypertension: Secondary | ICD-10-CM

## 2021-09-07 DIAGNOSIS — M159 Polyosteoarthritis, unspecified: Secondary | ICD-10-CM

## 2021-09-07 NOTE — Patient Instructions (Signed)
Hi Pat,  It was great to catch up with you again! I am glad you are feeling better!  Please reach out to me if you have any questions or need anything before our follow up!  Best, Maddie  Jeni Salles, PharmD, [place] Pharmacist [facility]   Visit Information   Goals Addressed   None    Patient Care Plan: CCM Pharmacy Care Plan     Problem Identified: Problem: Hypertension, Hyperlipidemia, Osteoarthritis, Allergic Rhinitis, and Migraines      Long-Range Goal: Patient-Specific Goal   Start Date: 03/29/2021  Expected End Date: 03/29/2022  Recent Progress: On track  Priority: High  Note:   Current Barriers:  Unable to independently monitor therapeutic efficacy Unable to achieve control of migraines   Pharmacist Clinical Goal(s):  Patient will achieve adherence to monitoring guidelines and medication adherence to achieve therapeutic efficacy through collaboration with PharmD and provider.   Interventions: 1:1 collaboration with Laurey Morale, MD regarding development and update of comprehensive plan of care as evidenced by provider attestation and co-signature Inter-disciplinary care team collaboration (see longitudinal plan of care) Comprehensive medication review performed; medication list updated in electronic medical record  Hypertension (BP goal <140/90) -Not ideally controlled -Current treatment: Amlodipine 5 mg 1 tablet daily  - Appropriate, Query effective, Safe, Accessible Triamterence-HCTZ 37.5-25 mg 1 tablet daily - Appropriate, Query effective, Safe, Accessible -Medications previously tried: none  -Current home readings: doesn't check it often; 132/64, 152/66 (before medications) -Current dietary habits: uses lite salt; tries not to overseason (had popcorn); looks at package labels -Current exercise habits: back in arthritis class in Febuary -Denies hypotensive/hypertensive symptoms -Educated on BP goals and  benefits of medications for prevention of heart attack, stroke and kidney damage; Exercise goal of 150 minutes per week; Importance of home blood pressure monitoring; Proper BP monitoring technique; -Counseled to monitor BP at home weekly, document, and provide log at future appointments -Counseled on diet and exercise extensively Recommended to continue current medication  Hyperlipidemia: (LDL goal < 100) -Controlled -Current treatment: Atorvastatin 20 mg 1 tablet daily - Appropriate, Effective, Safe, Accessible Fenofibrate 160 mg 1 tablet daily - Appropriate, Effective, Safe, Accessible Fish oil 1200 mg daily - Query Appropriate, Query effective, Safe, Accessible -Medications previously tried: none  -Current dietary patterns: limiting fried foods -Current exercise habits: water exercises -Educated on Cholesterol goals;  Benefits of statin for ASCVD risk reduction; Importance of limiting foods high in cholesterol; Exercise goal of 150 minutes per week; -Counseled on diet and exercise extensively Recommended to continue current medication  Ocular migraines (Goal: minimize severity of migraines) -Controlled -Current treatment  Sumatriptan 100 mg 1 tablet once daily as needed - Appropriate, Effective, Safe, Accessible -Medications previously tried: none  -Recommended to continue current medication  Osteopenia (Goal prevent fractures) -Uncontrolled -Last DEXA Scan: 07/2020   T-Score femoral neck: -2.2  T-Score total hip: n/a  T-Score lumbar spine: 0.2  T-Score forearm radius: n/a  10-year probability of major osteoporotic fracture: 22.5%  10-year probability of hip fracture: 6.2% -Patient is a candidate for pharmacologic treatment due to T-Score -1.0 to -2.5 and 10-year risk of major osteoporotic fracture > 20% and T-Score -1.0 to -2.5 and 10-year risk of hip fracture > 3% -Current treatment  Vitamin D 2000 units daily - Appropriate, Effective, Safe,  Accessible Calcium-carbonate-vitamin D 600-400 mg-unit - Appropriate, Effective, Safe, Accessible -Medications previously tried: Fosamax (completed 5 years), Evista  -Recommend [phone] units of vitamin D daily. Recommend 1200 mg of  calcium daily from dietary and supplemental sources. Recommend weight-bearing and muscle strengthening exercises for building and maintaining bone density. -Counseled on diet and exercise extensively Recommended decreasing to one calcium tablet per day based on current dietary intake.  Osteoarthritis (Goal: minimize pain) -Controlled -Current treatment  Diclofenac gel 1% as needed - Appropriate, Effective, Safe, Accessible Glucoasmine-chondroitin 1500-1200 mg 1 tablet daily in AM Tylenol 500 mg - 2 tablets in the morning and 2 tablets in the evening - Appropriate, Effective, Safe, Accessible Meloxicam 15 mg 1 tablet daily - taking 1 tablet every other day - Appropriate, Effective, Safe, Accessible -Medications previously tried: none  -Recommended to continue current medication   Health Maintenance -Vaccine gaps: influenza -Current therapy:  Vitamin C 500 mg twice daily  Multivitamin 50+ for women  -Educated on Cost vs benefit of each product must be carefully weighed by individual consumer -Patient is satisfied with current therapy and denies issues -Recommended to continue current medication  Patient Goals/Self-Care Activities Patient will:  - take medications as prescribed check blood pressure weekly, document, and provide at future appointments target a minimum of 150 minutes of moderate intensity exercise weekly  Follow Up Plan: Telephone follow up appointment with care management team member scheduled for:1 year       Patient verbalizes understanding of instructions and care plan provided today and agrees to view in [facility]. Active MyChart status confirmed with patient.   Telephone follow up appointment with pharmacy team member scheduled for:  1 year  Viona Gilmore, [facility]

## 2021-09-07 NOTE — Progress Notes (Signed)
Chronic Care Management Pharmacy Note  09/07/2021 Name:  Rachael Mitchell MRN:  [id number] DOB:  [birth year]  Summary: BP is at goal < 140/90  Pt reports frequent migraines and sensitivity to light  Recommendations/Changes made from today's visit: -Recommended continued BP monitoring at home -Recommend repeat DEXA at the end of 2023  Plan: BP assessment in 4-5 months   Subjective: Rachael Mitchell is an 76 y.o. year old female who is a primary patient of Laurey Morale, MD.  The CCM team was consulted for assistance with disease management and care coordination needs.    Engaged with patient by telephone for follow up visit in response to provider referral for pharmacy case management and/or care coordination services.   Consent to Services:  The patient was given information about Chronic Care Management services, agreed to services, and gave verbal consent prior to initiation of services.  Please see initial visit note for detailed documentation.   Patient Care Team: Laurey Morale, MD as PCP - General Viona Gilmore, [facility] as Pharmacist (Pharmacist)  Recent office visits: 08/02/21 Alysia Penna MD (PCP) - seen for hypertension. No medication changes. Plan for repeat CBC in 90 days.  07/01/21 Alysia Penna MD: Patient presented for pelvic pain. X-rays completed and showed fracture. Increased meloxicam to 15 mg daily and prescribed Norco 5-325 mg as needed.  06/23/2021 Grier Mitts MD (PCP) - Patient was seen for Right hip pain and additional issues. Started Meloxicam 7.5 mg daily. Follow up if symptoms return or fail to improve.   Recent consult visits: 09/01/21 Rodell Perna, MD (ortho surgery): Patient presented for pelvic fracture follow up. Placed order for x-ray and follow up in 1 month.  08/04/21 Rodell Perna, MD (ortho surgery): Patient presented for pelvic fracture follow up. Patient fell on 10/23 after trying to step over a gate.  07/08/21 Joni Fears, MD (ortho surgery):  Patient presented for lower back pain after fall.   Hospital visits: None in previous 6 months   Objective:  Lab Results  Component Value Date   CREATININE 0.69 08/02/2021   BUN 18 08/02/2021   GFR 84.38 08/02/2021   GFRNONAA 76 07/30/2020   GFRAA 88 07/30/2020   NA 133 (L) 08/02/2021   K 4.3 08/02/2021   CALCIUM 10.2 08/02/2021   CO2 26 08/02/2021   GLUCOSE 99 08/02/2021    Lab Results  Component Value Date/Time   HGBA1C 5.7 08/02/2021 09:16 AM   HGBA1C 5.5 07/30/2020 09:57 AM   GFR 84.38 08/02/2021 09:16 AM   GFR 69.07 07/30/2019 10:36 AM    Last diabetic Eye exam: No results found for: HMDIABEYEEXA  Last diabetic Foot exam: No results found for: HMDIABFOOTEX   Lab Results  Component Value Date   CHOL 123 08/02/2021   HDL 48.10 08/02/2021   LDLCALC 49 08/02/2021   LDLDIRECT 91.4 08/25/2006   TRIG 127.0 08/02/2021   CHOLHDL 3 08/02/2021    Hepatic Function Latest Ref Rng & Units 08/02/2021 07/30/2020 07/30/2019  Total Protein 6.0 - 8.3 g/dL 7.4 7.0 6.8  Albumin 3.5 - 5.2 g/dL 4.3 - 4.5  AST 0 - 37 U/L _0 ALT 0 - 35 U/L _1 Alk Phosphatase 39 - 117 U/L 100 - 55  Total Bilirubin 0.2 - 1.2 mg/dL 0.4 0.4 0.5  Bilirubin, Direct 0.0 - 0.3 mg/dL 0.1 0.1 0.1    Lab Results  Component Value Date/Time   TSH 1.25 08/02/2021 09:16 AM   TSH 1.41 07/30/2020  09:57 AM    CBC Latest Ref Rng & Units 08/02/2021 07/30/2020 07/30/2019  WBC 4.0 - 10.5 K/uL 7.9 7.2 8.0  Hemoglobin 12.0 - 15.0 g/dL 12.1 13.8 13.7  Hematocrit 36.0 - 46.0 % 35.1(L) 41.2 39.7  Platelets 150.0 - 400.0 K/uL 527.0 Repeated and verified X2.(H) 435(H) 421.0(H)    Lab Results  Component Value Date/Time   VD25OH 52.49 08/02/2021 09:16 AM   VD25OH 52 07/30/2020 09:57 AM   VD25OH 59.34 07/30/2019 10:36 AM    Clinical ASCVD: No  The ASCVD Risk score (Arnett DK, et al., 2019) failed to calculate for the following reasons:   The valid total cholesterol range is 130 to 320 mg/dL     Depression screen [facility] 2/9 08/02/2021 07/30/2020 07/30/2019  Decreased Interest 0 0 0  Down, Depressed, Hopeless 0 0 0  PHQ - 2 Score 0 0 0  Altered sleeping 2 - -  Tired, decreased energy 2 - -  Change in appetite 1 - -  Feeling bad or failure about yourself  0 - -  Trouble concentrating 0 - -  Moving slowly or fidgety/restless 0 - -  Suicidal thoughts 0 - -  PHQ-9 Score 5 - -  Difficult doing work/chores Not difficult at all - -     Social History   Tobacco Use  Smoking Status Former   Types: Cigarettes   Quit date: 09/24/1992   Years since quitting: 28.9  Smokeless Tobacco Never   BP Readings from Last 3 Encounters:  09/01/21 (!) 158/77  08/04/21 (!) 157/66  08/02/21 132/78   Pulse Readings from Last 3 Encounters:  09/01/21 80  08/04/21 82  08/02/21 73   Wt Readings from Last 3 Encounters:  09/01/21 144 lb (65.3 kg)  08/04/21 144 lb (65.3 kg)  08/02/21 144 lb 6 oz (65.5 kg)   BMI Readings from Last 3 Encounters:  09/01/21 23.60 kg/m  08/04/21 23.60 kg/m  08/02/21 23.66 kg/m    Assessment/Interventions: Review of patient past medical history, allergies, medications, health status, including review of consultants reports, laboratory and other test data, was performed as part of comprehensive evaluation and provision of chronic care management services.   SDOH:  (Social Determinants of Health) assessments and interventions performed: Yes   SDOH Screenings   Alcohol Screen: Not on file  Depression (PHQ2-9): Medium Risk   PHQ-2 Score: 5  Financial Resource Strain: Low Risk    Difficulty of Paying Living Expenses: Not hard at all  Food Insecurity: Not on file  Housing: Not on file  Physical Activity: Not on file  Social Connections: Not on file  Stress: Not on file  Tobacco Use: Medium Risk   Smoking Tobacco Use: Former   Smokeless Tobacco Use: Never   Passive Exposure: Not on file  Transportation Needs: No Transportation Needs   Lack of  Transportation (Medical): No   Lack of Transportation (Non-Medical): No   Patient stays very busy throughout the week. She usually reserves Mondays for doctor and car appointments. On Tuesday and Thursday she has water classes at the [facility] from 815-10, which includes water walking and aqua arthritis and she sometimes substitute teaches the classes as well. She is unable to do much other exercise due to the pain in her hips. She will then go to lunch with the people in her class. She also has church meeting on Tuesdays. On Wednesdays, she has Bible study in mornings and sometimes other appointments. On Fridays, she usually has day trips, eats out  some, or volunteers with the [employer]. On Sunday, she goes to church and usually spends the day with family and takes an afternoon nap.  Patient has someone doing the cleaning at the house every other week but she does the cooking if she eats at home. Her sister is doing weight watches which has been helpful. She eats about half of what she did and usually has chicken 4-5 days a week, fish twice a week, and beef or pork once. She does eat a lot of fruit and vegetables and avoids fried foods and only occasionally has dessert. She drinks water all day and will travel with diet soda for caffeine, as well as drinks 3 cups of coffee in the morning and sometimes ice tea with artifical sweetener.  She sleeps pretty well most of the time and goes to bed at 10 and gets up at 530-6. She sometimes has trouble falling asleep and will get up to go to bathroom during the night. She does take a 30 minute nap some afternoons.  Patient denies any problems with her medications and only takes 4 of them every day.   CCM Care Plan  No Known Allergies  Medications Reviewed Today     Reviewed by Elvin So L, RT (Technologist) on 09/01/21 at 1008  Med List Status: <None>   Medication Order Taking? Sig Documenting Provider Last Dose Status Informant  amLODipine (NORVASC)  5 MG tablet [id number] No TAKE 1 TABLET BY MOUTH EVERY DAY Laurey Morale, MD Taking Active   Ascorbic Acid (VITAMIN C) 500 MG tablet [id number] No Take 500 mg by mouth 2 (two) times daily. [provider] Taking Active   atorvastatin (LIPITOR) 20 MG tablet [id number] No Take 1 tablet (20 mg total) by mouth daily. Laurey Morale, MD Taking Active   Calcium Carbonate-Vitamin D 600-400 MG-UNIT tablet [id number] No Take 1 tablet by mouth 2 (two) times daily. [provider] Taking Active   cholecalciferol (VITAMIN D) 1000 UNITS tablet [id number] No Take 2,000 Units by mouth daily. Take 2 [provider] Taking Active   diclofenac Sodium (VOLTAREN) 1 % GEL [id number] No Apply 2 g topically 4 (four) times daily. Laurey Morale, MD Taking Active   fenofibrate 160 MG tablet [id number] No TAKE 1 TABLET BY MOUTH EVERY DAY Laurey Morale, MD Taking Active   fish oil-omega-3 fatty acids 1000 MG capsule [id number] No Take 1,200 mg by mouth daily. [provider] Taking Active   glucosamine-chondroitin 500-400 MG tablet [id number] No Take 1 tablet by mouth daily. Takes 1500-1200 mg 1 tablet daily [provider] Taking Active   meloxicam (MOBIC) 15 MG tablet [id number] No Take 1 tablet (15 mg total) by mouth daily. Laurey Morale, MD Taking Active   Multiple Vitamin (MULTIVITAMIN) tablet [id number] No Take 1 tablet by mouth daily. [provider] Taking Active   SUMAtriptan (IMITREX) 100 MG tablet [id number] No Take 1 tablet (100 mg total) by mouth once as needed. Laurey Morale, MD Taking Active   triamterene-hydrochlorothiazide [facility]) 37.5-25 MG tablet [id number] No Take 1 tablet by mouth daily. Laurey Morale, MD Taking Active             Patient Active Problem List   Diagnosis Date Noted   Sacral fracture, closed ([place]) 08/04/2021   Hyperglycemia 12/19/2016   Osteoarthritis 12/10/2012   Migraine 06/07/2010   Vitamin D deficiency 12/01/2008   ALLERGIC RHINITIS  12/01/2008   SKIN CANCER, HX OF 05/28/2007  Hyperlipidemia 04/17/2007   Essential hypertension 04/17/2007   Disorder of bone and cartilage 04/17/2007    Immunization History  Administered Date(s) Administered   Fluad Quad(high Dose 65+) 04/30/2019, 05/25/2020   Influenza Split 06/09/2011, 06/11/2012   Influenza Whole 05/28/2007, 06/03/2008, 06/05/2009, 06/07/2010   Influenza, High Dose Seasonal PF 06/18/2015, 06/21/2016, 07/10/2017, 07/16/2018, 05/17/2021   Influenza,inj,Quad PF,6+ Mos 06/14/2013, 06/16/2014   PFIZER(Purple Top)SARS-COV-2 Vaccination 09/27/2019, 10/23/2019, 06/22/2020, 01/05/2021   Pfizer Covid-19 Vaccine Bivalent Booster 72yr & up 06/09/2021   Pneumococcal Conjugate-13 06/18/2015   Pneumococcal Polysaccharide-23 05/18/2006, 06/11/2012   Td 04/22/2005   Tdap 06/18/2015   Zoster Recombinat (Shingrix) 01/20/2018, 04/07/2018   Zoster, Live 06/25/2012   Patient is not back to her pre-fall days and she is doing much better than Nov and December. She is slowly getting off of walkers and is only using the cane when she goes out. She usually takes the cart at the grocery store instead of a walker. She is hoping by EMozambiqueshe will be healed.  Not going outside as often and hasn't had an ocular migraine in months   BP Readings from Last 3 Encounters:  09/01/21 (!) 158/77  08/04/21 (!) 157/66  08/02/21 132/78   No real headaches lately - no migraines  Patient is goin back to an arthritis class in feburary. She will think about the water aerobics -  Conditions to be addressed/monitored:  Hypertension, Hyperlipidemia, Osteoarthritis, Allergic Rhinitis, and Migraines  Conditions addressed this visit: Osteoarthritis, hypertension  Care Plan : CCM Pharmacy Care Plan  Updates made by PViona Gilmore RLeopolissince 09/07/2021 12:00 AM     Problem: Problem: Hypertension, Hyperlipidemia, Osteoarthritis, Allergic Rhinitis, and Migraines      Long-Range Goal:  Patient-Specific Goal   Start Date: 03/29/2021  Expected End Date: 03/29/2022  Recent Progress: On track  Priority: High  Note:   Current Barriers:  Unable to independently monitor therapeutic efficacy Unable to achieve control of migraines   Pharmacist Clinical Goal(s):  Patient will achieve adherence to monitoring guidelines and medication adherence to achieve therapeutic efficacy through collaboration with PharmD and provider.   Interventions: 1:1 collaboration with FLaurey Morale MD regarding development and update of comprehensive plan of care as evidenced by provider attestation and co-signature Inter-disciplinary care team collaboration (see longitudinal plan of care) Comprehensive medication review performed; medication list updated in electronic medical record  Hypertension (BP goal <140/90) -Not ideally controlled -Current treatment: Amlodipine 5 mg 1 tablet daily  - Appropriate, Query effective, Safe, Accessible Triamterence-HCTZ 37.5-25 mg 1 tablet daily - Appropriate, Query effective, Safe, Accessible -Medications previously tried: none  -Current home readings: doesn't check it often; 132/64, 152/66 (before medications) -Current dietary habits: uses lite salt; tries not to overseason (had popcorn); looks at package labels -Current exercise habits: back in arthritis class in Febuary -Denies hypotensive/hypertensive symptoms -Educated on BP goals and benefits of medications for prevention of heart attack, stroke and kidney damage; Exercise goal of 150 minutes per week; Importance of home blood pressure monitoring; Proper BP monitoring technique; -Counseled to monitor BP at home weekly, document, and provide log at future appointments -Counseled on diet and exercise extensively Recommended to continue current medication  Hyperlipidemia: (LDL goal < 100) -Controlled -Current treatment: Atorvastatin 20 mg 1 tablet daily - Appropriate, Effective, Safe,  Accessible Fenofibrate 160 mg 1 tablet daily - Appropriate, Effective, Safe, Accessible Fish oil 1200 mg daily - Query Appropriate, Query effective, Safe, Accessible -Medications previously tried: none  -Current dietary patterns: limiting fried  foods -Current exercise habits: water exercises -Educated on Cholesterol goals;  Benefits of statin for ASCVD risk reduction; Importance of limiting foods high in cholesterol; Exercise goal of 150 minutes per week; -Counseled on diet and exercise extensively Recommended to continue current medication  Ocular migraines (Goal: minimize severity of migraines) -Controlled -Current treatment  Sumatriptan 100 mg 1 tablet once daily as needed - Appropriate, Effective, Safe, Accessible -Medications previously tried: none  -Recommended to continue current medication  Osteopenia (Goal prevent fractures) -Uncontrolled -Last DEXA Scan: 07/2020   T-Score femoral neck: -2.2  T-Score total hip: n/a  T-Score lumbar spine: 0.2  T-Score forearm radius: n/a  10-year probability of major osteoporotic fracture: 22.5%  10-year probability of hip fracture: 6.2% -Patient is a candidate for pharmacologic treatment due to T-Score -1.0 to -2.5 and 10-year risk of major osteoporotic fracture > 20% and T-Score -1.0 to -2.5 and 10-year risk of hip fracture > 3% -Current treatment  Vitamin D 2000 units daily - Appropriate, Effective, Safe, Accessible Calcium-carbonate-vitamin D 600-400 mg-unit - Appropriate, Effective, Safe, Accessible -Medications previously tried: Fosamax (completed 5 years), Evista  -Recommend [phone] units of vitamin D daily. Recommend 1200 mg of calcium daily from dietary and supplemental sources. Recommend weight-bearing and muscle strengthening exercises for building and maintaining bone density. -Counseled on diet and exercise extensively Recommended decreasing to one calcium tablet per day based on current dietary intake.  Osteoarthritis  (Goal: minimize pain) -Controlled -Current treatment  Diclofenac gel 1% as needed - Appropriate, Effective, Safe, Accessible Glucoasmine-chondroitin 1500-1200 mg 1 tablet daily in AM Tylenol 500 mg - 2 tablets in the morning and 2 tablets in the evening - Appropriate, Effective, Safe, Accessible Meloxicam 15 mg 1 tablet daily - taking 1 tablet every other day - Appropriate, Effective, Safe, Accessible -Medications previously tried: none  -Recommended to continue current medication   Health Maintenance -Vaccine gaps: influenza -Current therapy:  Vitamin C 500 mg twice daily  Multivitamin 50+ for women  -Educated on Cost vs benefit of each product must be carefully weighed by individual consumer -Patient is satisfied with current therapy and denies issues -Recommended to continue current medication  Patient Goals/Self-Care Activities Patient will:  - take medications as prescribed check blood pressure weekly, document, and provide at future appointments target a minimum of 150 minutes of moderate intensity exercise weekly  Follow Up Plan: Telephone follow up appointment with care management team member scheduled for:1 year      Medication Assistance: None required.  Patient affirms current coverage meets needs.  Compliance/Adherence/Medication fill history: Care Gaps: Influenza vaccine Last BP - 158/77 on 09/01/2021  Star-Rating Drugs: Atorvastatin 20 mg - last filled [facility]/03/2021 [facility]  Patient's preferred pharmacy is:  [id number] [place] [place] - [address]Phone: [phone]: [phone]  Uses pill box? Yes - 2 weeks at a time Pt endorses 99% compliance -mostly night time is missed if missed  We discussed: Current pharmacy is preferred with insurance plan and patient is satisfied with pharmacy services Patient decided to: Continue current medication  management strategy  Care Plan and Follow Up Patient Decision:  Patient agrees to Care Plan and Follow-up.  Plan: Telephone follow up appointment with care management team member scheduled for:  1 year  MJeni Salles PharmD, BRedwood ValleyPharmacist LOccidental Petroleumat [phone]

## 2021-09-17 ENCOUNTER — Other Ambulatory Visit: Payer: Self-pay | Admitting: Family Medicine

## 2021-09-17 DIAGNOSIS — I10 Essential (primary) hypertension: Secondary | ICD-10-CM

## 2021-09-21 DIAGNOSIS — I10 Essential (primary) hypertension: Secondary | ICD-10-CM | POA: Diagnosis not present

## 2021-09-21 DIAGNOSIS — E785 Hyperlipidemia, unspecified: Secondary | ICD-10-CM | POA: Diagnosis not present

## 2021-09-21 DIAGNOSIS — M1990 Unspecified osteoarthritis, unspecified site: Secondary | ICD-10-CM

## 2021-09-21 DIAGNOSIS — M8580 Other specified disorders of bone density and structure, unspecified site: Secondary | ICD-10-CM | POA: Diagnosis not present

## 2021-09-29 ENCOUNTER — Ambulatory Visit (INDEPENDENT_AMBULATORY_CARE_PROVIDER_SITE_OTHER): Payer: Medicare Other | Admitting: Orthopaedic Surgery

## 2021-09-29 ENCOUNTER — Ambulatory Visit (INDEPENDENT_AMBULATORY_CARE_PROVIDER_SITE_OTHER): Payer: Medicare Other

## 2021-09-29 ENCOUNTER — Other Ambulatory Visit: Payer: Self-pay

## 2021-09-29 ENCOUNTER — Encounter: Payer: Self-pay | Admitting: Orthopaedic Surgery

## 2021-09-29 VITALS — BP 155/61 | HR 80 | Ht 65.5 in | Wt 144.0 lb

## 2021-09-29 DIAGNOSIS — S3210XA Unspecified fracture of sacrum, initial encounter for closed fracture: Secondary | ICD-10-CM

## 2021-09-29 NOTE — Progress Notes (Signed)
Office Visit Note   Patient: Rachael Mitchell           Date of Birth: 03/12/1946           MRN: [id number] Visit Date: 09/29/2021              Requested by: Laurey Morale, MD [place],  [place] [address] PCP: Laurey Morale, MD   Assessment & Plan: Visit Diagnoses:  1. Closed fracture of sacrum, unspecified portion of sacrum, initial encounter [facility])     Plan: Patient can progress with activity increase ambulation.  Continue water aerobics.  She will wean away off the walker and cane.  Return as needed.  Follow-Up Instructions: No follow-ups on file.   Orders:  Orders Placed This Encounter  Procedures   XR Sacrum/Coccyx   No orders of the defined types were placed in this encounter.     Procedures: No procedures performed   Clinical Data: No additional findings.   Subjective: Chief Complaint  Patient presents with   sacrum    Fall 06/13/2021 Fx follow up    HPI follow-up sacral fracture.  Patient is ambulating in the house without the walker she uses a walker when she goes out.  She also has a cane.  She is using the stairs at home and is returned to water class.  New x-rays are obtained today.  Date of injury from fall was 06/13/2021.  1 episode when she slipped off the rolling walker with reverse seat but did not really injure self.  Review of Systems updated unchanged   Objective: Vital Signs: BP (!) 155/61    Pulse 80    Ht 5' 5.5" (1.664 m)    Wt 144 lb (65.3 kg)    BMI 23.60 kg/m   Physical Exam Constitutional:      Appearance: She is well-developed.  HENT:     Head: Normocephalic.     Right Ear: External ear normal.     Left Ear: External ear normal. There is no impacted cerumen.  Eyes:     Pupils: Pupils are equal, round, and reactive to light.  Neck:     Thyroid: No thyromegaly.     Trachea: No tracheal deviation.  Cardiovascular:     Rate and Rhythm: Normal rate.  Pulmonary:     Effort: Pulmonary effort is normal.   Abdominal:     Palpations: Abdomen is soft.  Musculoskeletal:     Cervical back: No rigidity.  Skin:    General: Skin is warm and dry.  Neurological:     Mental Status: She is alert and oriented to person, place, and time.  Psychiatric:        Behavior: Behavior normal.    Ortho Exam intact lower extremity sensorimotor.  She can ambulate in the office without limp.  Specialty Comments:  No specialty comments available.  Imaging: No results found.   PMFS History: Patient Active Problem List   Diagnosis Date Noted   Sacral fracture, closed ([place]) 08/04/2021   Hyperglycemia 12/19/2016   Osteoarthritis 12/10/2012   Migraine 06/07/2010   Vitamin D deficiency 12/01/2008   ALLERGIC RHINITIS 12/01/2008   SKIN CANCER, HX OF 05/28/2007   Hyperlipidemia 04/17/2007   Essential hypertension 04/17/2007   Disorder of bone and cartilage 04/17/2007   Past Medical History:  Diagnosis Date   Arthritis    Cancer ([place])    basal cell CA- face and back   Hyperlipidemia    Hypertension  Migraine    Osteopenia    last DEXA 06-09-11   Vitamin D deficiency     Family History  Problem Relation Age of Onset   Heart failure Brother    Diabetes Father    Arthritis Other    Coronary artery disease Other    Hypertension Other    Lung cancer Other    Osteoporosis Other    Heart failure Other    Colon cancer Neg Hx    Esophageal cancer Neg Hx    Stomach cancer Neg Hx    Rectal cancer Neg Hx     Past Surgical History:  Procedure Laterality Date   BASAL CELL CARCINOMA EXCISION     face back   CATARACT EXTRACTION, BILATERAL     colonoscopy  11/27/2017   per Dr. Hilarie Fredrickson, benign polyps, repeat in 5 yrs (hx of adenomas)    polpectomy  2007   colon-in [place].   POLYPECTOMY     Social History   Occupational History   Not on file  Tobacco Use   Smoking status: Former    Types: Cigarettes    Quit date: 09/24/1992    Years since quitting: 29.0   Smokeless tobacco: Never  Vaping Use    Vaping Use: Never used  Substance and Sexual Activity   Alcohol use: Yes    Alcohol/week: 3.0 standard drinks    Types: 3 Standard drinks or equivalent per week    Comment: 3 glasses of wine a week   Drug use: No   Sexual activity: Not on file

## 2021-09-30 ENCOUNTER — Telehealth: Payer: Self-pay | Admitting: Family Medicine

## 2021-09-30 NOTE — Telephone Encounter (Signed)
Left message for patient to call back and schedule Medicare Annual Wellness Visit (AWV) either virtually or in office. Left  my Rachael Mitchell number [phone]  awvi 03/23/11 per [facility]  please schedule at anytime with [facility] Nurse Health Advisor 1 or 2  Please verify medicare is primary and fed bcbs is 2nd before scheduling  This should be a 45 minute visit.

## 2021-10-18 DIAGNOSIS — Z08 Encounter for follow-up examination after completed treatment for malignant neoplasm: Secondary | ICD-10-CM | POA: Diagnosis not present

## 2021-10-18 DIAGNOSIS — Z85828 Personal history of other malignant neoplasm of skin: Secondary | ICD-10-CM | POA: Diagnosis not present

## 2021-11-01 ENCOUNTER — Other Ambulatory Visit (INDEPENDENT_AMBULATORY_CARE_PROVIDER_SITE_OTHER): Payer: Medicare Other

## 2021-11-01 DIAGNOSIS — U071 COVID-19: Secondary | ICD-10-CM | POA: Diagnosis not present

## 2021-11-01 DIAGNOSIS — R7989 Other specified abnormal findings of blood chemistry: Secondary | ICD-10-CM | POA: Diagnosis not present

## 2021-11-01 LAB — CBC WITH DIFFERENTIAL/PLATELET
Basophils Absolute: 0.1 10*3/uL (ref 0.0–0.1)
Basophils Relative: 0.7 % (ref 0.0–3.0)
Eosinophils Absolute: 0.1 10*3/uL (ref 0.0–0.7)
Eosinophils Relative: 1.9 % (ref 0.0–5.0)
HCT: 38.2 % (ref 36.0–46.0)
Hemoglobin: 13.2 g/dL (ref 12.0–15.0)
Lymphocytes Relative: 44.9 % (ref 12.0–46.0)
Lymphs Abs: 3.2 10*3/uL (ref 0.7–4.0)
MCHC: 34.7 g/dL (ref 30.0–36.0)
MCV: 88.1 fl (ref 78.0–100.0)
Monocytes Absolute: 0.5 10*3/uL (ref 0.1–1.0)
Monocytes Relative: 6.9 % (ref 3.0–12.0)
Neutro Abs: 3.2 10*3/uL (ref 1.4–7.7)
Neutrophils Relative %: 45.6 % (ref 43.0–77.0)
Platelets: 460 10*3/uL — ABNORMAL HIGH (ref 150.0–400.0)
RBC: 4.33 Mil/uL (ref 3.87–5.11)
RDW: 12.7 % (ref 11.5–15.5)
WBC: 7.1 10*3/uL (ref 4.0–10.5)

## 2021-11-29 ENCOUNTER — Ambulatory Visit (INDEPENDENT_AMBULATORY_CARE_PROVIDER_SITE_OTHER): Payer: Medicare Other

## 2021-11-29 VITALS — BP 122/62 | HR 81 | Temp 97.8°F | Ht 65.5 in | Wt 146.7 lb

## 2021-11-29 DIAGNOSIS — Z0000 Encounter for general adult medical examination without abnormal findings: Secondary | ICD-10-CM

## 2021-11-29 NOTE — Patient Instructions (Addendum)
?Rachael Mitchell , ?Thank you for taking time to come for your Medicare Wellness Visit. I appreciate your ongoing commitment to your health goals. Please review the following plan we discussed and let me know if I can assist you in the future.  ? ?These are the goals we discussed: ? Goals   ? ?   Increase physical activity (pt-stated)   ?   Walk more. ?  ?   Manage My Medicine   ?   Timeframe:  Short-Term Goal ?Priority:  High ?Start Date:                             ?Expected End Date:                      ? ?Follow Up Date 07/06/21  ?  ?- keep a list of all the medicines I take; vitamins and herbals too ?- use a pillbox to sort medicine ?- use an alarm clock or phone to remind me to take my medicine  ?  ?Why is this important?   ?These steps will help you keep on track with your medicines. ?  ?Notes:  ?  ? ?  ?  ?This is a list of the screening recommended for you and due dates:  ?Health Maintenance  ?Topic Date Due  ? Flu Shot  [birth year]/08[birth year]2021  ? Colon Cancer Screening  11/28/2022  ? Tetanus Vaccine  06/17/2025  ? Pneumonia Vaccine  Completed  ? DEXA scan (bone density measurement)  Completed  ? COVID-19 Vaccine  Completed  ? Hepatitis C Screening: USPSTF Recommendation to screen - Ages 77-79 yo.  Completed  ? Zoster (Shingles) Vaccine  Completed  ? HPV Vaccine  Aged Out  ? ?Advanced directives: Yes Patient will bring copy ? ?Conditions/risks identified: None ? ?Next appointment: Follow up in one year for your annual wellness visit  ? ? ?Preventive Care 77 Years and Older, Female ?Preventive care refers to lifestyle choices and visits with your health care provider that can promote health and wellness. ?What does preventive care include? ?A yearly physical exam. This is also called an annual well check. ?Dental exams once or twice a year. ?Routine eye exams. Ask your health care provider how often you should have your eyes checked. ?Personal lifestyle choices, including: ?Daily care of your teeth and gums. ?Regular  physical activity. ?Eating a healthy diet. ?Avoiding tobacco and drug use. ?Limiting alcohol use. ?Practicing safe sex. ?Taking low-dose aspirin every day. ?Taking vitamin and mineral supplements as recommended by your health care provider. ?What happens during an annual well check? ?The services and screenings done by your health care provider during your annual well check will depend on your age, overall health, lifestyle risk factors, and family history of disease. ?Counseling  ?Your health care provider may ask you questions about your: ?Alcohol use. ?Tobacco use. ?Drug use. ?Emotional well-being. ?Home and relationship well-being. ?Sexual activity. ?Eating habits. ?History of falls. ?Memory and ability to understand (cognition). ?Work and work Statistician. ?Reproductive health. ?Screening  ?You may have the following tests or measurements: ?Height, weight, and BMI. ?Blood pressure. ?Lipid and cholesterol levels. These may be checked every 5 years, or more frequently if you are over 77 years old. ?Skin check. ?Lung cancer screening. You may have this screening every year starting at age 77 if you have a 30-pack-year history of smoking and currently smoke or have quit within the past 15  years. ?Fecal occult blood test (FOBT) of the stool. You may have this test every year starting at age 77. ?Flexible sigmoidoscopy or colonoscopy. You may have a sigmoidoscopy every 5 years or a colonoscopy every 10 years starting at age 77. ?Hepatitis C blood test. ?Hepatitis B blood test. ?Sexually transmitted disease (STD) testing. ?Diabetes screening. This is done by checking your blood sugar (glucose) after you have not eaten for a while (fasting). You may have this done every 1-3 years. ?Bone density scan. This is done to screen for osteoporosis. You may have this done starting at age 77. ?Mammogram. This may be done every 1-2 years. Talk to your health care provider about how often you should have regular mammograms. ?Talk  with your health care provider about your test results, treatment options, and if necessary, the need for more tests. ?Vaccines  ?Your health care provider may recommend certain vaccines, such as: ?Influenza vaccine. This is recommended every year. ?Tetanus, diphtheria, and acellular pertussis (Tdap, Td) vaccine. You may need a Td booster every 10 years. ?Zoster vaccine. You may need this after age 5. ?Pneumococcal 13-valent conjugate (PCV13) vaccine. One dose is recommended after age 77. ?Pneumococcal polysaccharide (PPSV23) vaccine. One dose is recommended after age 77. ?Talk to your health care provider about which screenings and vaccines you need and how often you need them. ?This information is not intended to replace advice given to you by your health care provider. Make sure you discuss any questions you have with your health care provider. ?Document Released: 09/04/2015 Document Revised: 04/27/2016 Document Reviewed: 06/09/2015 ?Elsevier Interactive Patient Education ? 2017 [place]. ? ?Fall Prevention in the Home ?Falls can cause injuries. They can happen to people of all ages. There are many things you can do to make your home safe and to help prevent falls. ?What can I do on the outside of my home? ?Regularly fix the edges of walkways and driveways and fix any cracks. ?Remove anything that might make you trip as you walk through a door, such as a raised step or threshold. ?Trim any bushes or trees on the path to your home. ?Use bright outdoor lighting. ?Clear any walking paths of anything that might make someone trip, such as rocks or tools. ?Regularly check to see if handrails are loose or broken. Make sure that both sides of any steps have handrails. ?Any raised decks and porches should have guardrails on the edges. ?Have any leaves, snow, or ice cleared regularly. ?Use sand or salt on walking paths during winter. ?Clean up any spills in your garage right away. This includes oil or grease  spills. ?What can I do in the bathroom? ?Use night lights. ?Install grab bars by the toilet and in the tub and shower. Do not use towel bars as grab bars. ?Use non-skid mats or decals in the tub or shower. ?If you need to sit down in the shower, use a plastic, non-slip stool. ?Keep the floor dry. Clean up any water that spills on the floor as soon as it happens. ?Remove soap buildup in the tub or shower regularly. ?Attach bath mats securely with double-sided non-slip rug tape. ?Do not have throw rugs and other things on the floor that can make you trip. ?What can I do in the bedroom? ?Use night lights. ?Make sure that you have a light by your bed that is easy to reach. ?Do not use any sheets or blankets that are too big for your bed. They should not hang down  onto the floor. ?Have a firm chair that has side arms. You can use this for support while you get dressed. ?Do not have throw rugs and other things on the floor that can make you trip. ?What can I do in the kitchen? ?Clean up any spills right away. ?Avoid walking on wet floors. ?Keep items that you use a lot in easy-to-reach places. ?If you need to reach something above you, use a strong step stool that has a grab bar. ?Keep electrical cords out of the way. ?Do not use floor polish or wax that makes floors slippery. If you must use wax, use non-skid floor wax. ?Do not have throw rugs and other things on the floor that can make you trip. ?What can I do with my stairs? ?Do not leave any items on the stairs. ?Make sure that there are handrails on both sides of the stairs and use them. Fix handrails that are broken or loose. Make sure that handrails are as long as the stairways. ?Check any carpeting to make sure that it is firmly attached to the stairs. Fix any carpet that is loose or worn. ?Avoid having throw rugs at the top or bottom of the stairs. If you do have throw rugs, attach them to the floor with carpet tape. ?Make sure that you have a light switch at the  top of the stairs and the bottom of the stairs. If you do not have them, ask someone to add them for you. ?What else can I do to help prevent falls? ?Wear shoes that: ?Do not have high heels. ?Have rubber bottoms.

## 2021-11-29 NOTE — Progress Notes (Signed)
? ?Subjective:  ? Rachael Mitchell is a 76 y.o. female who presents for Medicare Annual (Subsequent) preventive examination. ? ?Review of Systems    ? ? ?   ?Objective:  ?  ?Today's Vitals  ? 11/29/21 1230  ?BP: 122/62  ?Pulse: 81  ?Temp: 97.8 ?F (36.6 ?C)  ?TempSrc: Oral  ?SpO2: 97%  ?Weight: 146 lb 11.2 oz (66.5 kg)  ?Height: 5' 5.5" (1.664 m)  ? ?Body mass index is 24.04 kg/m?. ? ? ?  11/29/2021  ? 12:53 PM 11/27/2017  ?  7:23 AM  ?Advanced Directives  ?Does Patient Have a Medical Advance Directive? Yes Yes  ?Type of Paramedic of [place];Living will [place];Living will;Out of facility DNR (pink MOST or yellow form)  ?Does patient want to make changes to medical advance directive? No - Patient declined   ?Copy of [place] in Chart? No - copy requested No - copy requested  ? ? ?Current Medications (verified) ?Outpatient Encounter Medications as of 11/29/2021  ?Medication Sig  ? amLODipine (NORVASC) 5 MG tablet TAKE 1 TABLET BY MOUTH EVERY DAY  ? Ascorbic Acid (VITAMIN C) 500 MG tablet Take 500 mg by mouth 2 (two) times daily.  ? atorvastatin (LIPITOR) 20 MG tablet Take 1 tablet (20 mg total) by mouth daily.  ? Calcium Carbonate-Vitamin D 600-400 MG-UNIT tablet Take 1 tablet by mouth 2 (two) times daily.  ? cholecalciferol (VITAMIN D) 1000 UNITS tablet Take 2,000 Units by mouth daily. Take 2  ? diclofenac Sodium (VOLTAREN) 1 % GEL Apply 2 g topically 4 (four) times daily.  ? fenofibrate 160 MG tablet TAKE 1 TABLET BY MOUTH EVERY DAY  ? fish oil-omega-3 fatty acids 1000 MG capsule Take 1,200 mg by mouth daily.  ? glucosamine-chondroitin 500-400 MG tablet Take 1 tablet by mouth daily. Takes 1500-1200 mg 1 tablet daily  ? meloxicam (MOBIC) 15 MG tablet Take 1 tablet (15 mg total) by mouth daily.  ? Multiple Vitamin (MULTIVITAMIN) tablet Take 1 tablet by mouth daily.  ? SUMAtriptan (IMITREX) 100 MG tablet Take 1 tablet (100 mg total) by mouth once as needed.  ?  triamterene-hydrochlorothiazide (MAXZIDE-25) 37.5-25 MG tablet Take 1 tablet by mouth daily.  ? ?No facility-administered encounter medications on file as of 11/29/2021.  ? ? ?Allergies (verified) ?Patient has no known allergies.  ? ?History: ?Past Medical History:  ?Diagnosis Date  ? Arthritis   ? Cancer [facility])   ? basal cell CA- face and back  ? Hyperlipidemia   ? Hypertension   ? Migraine   ? Osteopenia   ? last DEXA 06-09-11  ? Vitamin D deficiency   ? ?Past Surgical History:  ?Procedure Laterality Date  ? BASAL CELL CARCINOMA EXCISION    ? face back  ? CATARACT EXTRACTION, BILATERAL    ? colonoscopy  11/27/2017  ? per Dr. Hilarie Fredrickson, benign polyps, repeat in 5 yrs (hx of adenomas)   ? polpectomy  2007  ? colon-in [place].  ? POLYPECTOMY    ? ?Family History  ?Problem Relation Age of Onset  ? Heart failure Brother   ? Diabetes Father   ? Arthritis Other   ? Coronary artery disease Other   ? Hypertension Other   ? Lung cancer Other   ? Osteoporosis Other   ? Heart failure Other   ? Colon cancer Neg Hx   ? Esophageal cancer Neg Hx   ? Stomach cancer Neg Hx   ? Rectal cancer Neg  Hx   ? ?Social History  ? ?Socioeconomic History  ? Marital status: Single  ?  Spouse name: Not on file  ? Number of children: Not on file  ? Years of education: Not on file  ? Highest education level: Not on file  ?Occupational History  ? Not on file  ?Tobacco Use  ? Smoking status: Former  ?  Types: Cigarettes  ?  Quit date: 09/24/1992  ?  Years since quitting: 29.2  ? Smokeless tobacco: Never  ?Vaping Use  ? Vaping Use: Never used  ?Substance and Sexual Activity  ? Alcohol use: Yes  ?  Alcohol/week: 3.0 standard drinks  ?  Types: 3 Standard drinks or equivalent per week  ?  Comment: 3 glasses of wine a week  ? Drug use: No  ? Sexual activity: Not on file  ?Other Topics Concern  ? Not on file  ?Social History Narrative  ? Not on file  ? ?Social Determinants of Health  ? ?Financial Resource Strain: Low Risk   ? Difficulty of Paying Living Expenses: Not  hard at all  ?Food Insecurity: No Food Insecurity  ? Worried About Charity fundraiser in the Last Year: Never true  ? Ran Out of Food in the Last Year: Never true  ?Transportation Needs: No Transportation Needs  ? Lack of Transportation (Medical): No  ? Lack of Transportation (Non-Medical): No  ?Physical Activity: Insufficiently Active  ? Days of Exercise per Week: 2 days  ? Minutes of Exercise per Session: 60 min  ?Stress: No Stress Concern Present  ? Feeling of Stress : Not at all  ?Social Connections: Moderately Integrated  ? Frequency of Communication with Friends and Family: More than three times a week  ? Frequency of Social Gatherings with Friends and Family: More than three times a week  ? Attends Religious Services: More than 4 times per year  ? Active Member of Clubs or Organizations: Yes  ? Attends Archivist Meetings: More than 4 times per year  ? Marital Status: Never married  ? ? ? ?Clinical Intake: ? ?How often do you need to have someone help you when you read instructions, pamphlets, or other written materials from your doctor or pharmacy?: 1 - Never ? ?Diabetic?  No ? ? ? ?Activities of Daily Living ? ?  11/29/2021  ? 12:48 PM 08/02/2021  ?  8:40 AM  ?In your present state of health, do you have any difficulty performing the following activities:  ?Hearing? 0 0  ?Vision? 0 0  ?Difficulty concentrating or making decisions? 0 0  ?Walking or climbing stairs? 0 1  ?Comment  back injury from a fall  ?Dressing or bathing? 0 0  ?Doing errands, shopping? 0 0  ?Preparing Food and eating ? N   ?Using the Toilet? N   ?In the past six months, have you accidently leaked urine? N   ?Do you have problems with loss of bowel control? N   ?Managing your Medications? N   ?Managing your Finances? N   ?Housekeeping or managing your Housekeeping? N   ? ? ?Patient Care Team: ?Laurey Morale, MD as PCP - General ?Viona Gilmore, [facility] as Pharmacist (Pharmacist) ? ?Indicate any recent Medical Services you may  have received from other than [facility] providers in the past year (date may be approximate). ? ?   ?Assessment:  ? This is a routine wellness examination for Rachael Mitchell. ? ?Hearing/Vision screen ?Hearing Screening - Comments:: No difficulty  hearing ?Vision Screening - Comments:: Wears glasses followed by Dr Sabra Heck ? ?Dietary issues and exercise activities discussed: ?Exercise limited by: None identified ? ? Goals Addressed   ? ?  ?  ?  ?  ?  ? This Visit's Progress  ?   Increase physical activity (pt-stated)     ?   Walk more. ?  ? ?  ? ?Depression Screen ? ?  11/29/2021  ? 12:46 PM 08/02/2021  ?  8:38 AM 07/30/2020  ?  8:54 AM 07/30/2019  ?  9:59 AM 07/16/2018  ?  9:12 AM 07/10/2017  ?  8:28 AM 12/17/2014  ?  8:40 AM  ?PHQ 2/9 Scores  ?PHQ - 2 Score 0 0 0 0 0 0 0  ?PHQ- 9 Score  5       ?  ?Fall Risk ? ?  11/29/2021  ? 12:49 PM 08/02/2021  ?  8:39 AM 07/01/2021  ?  2:11 PM 07/30/2020  ?  8:55 AM 07/30/2019  ? 10:00 AM  ?Fall Risk   ?Falls in the past year? '1 1 1 '$ 0 0  ?Number falls in past yr: 0 1 0 0 0  ?Injury with Fall? '1 1 1 '$ 0 0  ?Comment Hairline Fx sacram. Followed by [facility] back injury Left hip pain    ?Risk for fall due to : Impaired balance/gait Orthopedic patient No Fall Risks    ?Follow up  Falls evaluation completed Follow up appointment    ? ? ?FALL RISK PREVENTION PERTAINING TO THE HOME: ? ?Any stairs in or around the home? Yes  ?If so, are there any without handrails? No  ?Home free of loose throw rugs in walkways, pet beds, electrical cords, etc? Yes  ?Adequate lighting in your home to reduce risk of falls? Yes  ? ?ASSISTIVE DEVICES UTILIZED TO PREVENT FALLS: ? ?Life alert? No  ?Use of a cane, walker or w/c? No  ?Grab bars in the bathroom? Yes  ?Shower chair or bench in shower? Yes  ?Elevated toilet seat or a handicapped toilet? Yes  ? ?TIMED UP AND GO: ? ?Was the test performed? Yes .  ?Length of time to ambulate 10 feet: 5 sec.  ? ?Gait steady and fast without use of assistive device ? ?Cognitive Function: ?   ?  ? ?  11/29/2021  ? 12:54 PM  ?6CIT Screen  ?What Year? 0 points  ?What month? 0 points  ?What time? 0 points  ?Count back from 20 0 points  ?Months in reverse 0 points  ?Repeat phrase 0 points  ?Tot

## 2021-12-01 ENCOUNTER — Other Ambulatory Visit: Payer: Self-pay | Admitting: Family Medicine

## 2021-12-01 DIAGNOSIS — Z1231 Encounter for screening mammogram for malignant neoplasm of breast: Secondary | ICD-10-CM

## 2021-12-04 DIAGNOSIS — U071 COVID-19: Secondary | ICD-10-CM | POA: Diagnosis not present

## 2021-12-22 ENCOUNTER — Ambulatory Visit
Admission: RE | Admit: 2021-12-22 | Discharge: 2021-12-22 | Disposition: A | Discharge status: Home / Self Care | Payer: Medicare Other | Source: Ambulatory Visit | Attending: Family Medicine | Admitting: Family Medicine

## 2021-12-22 DIAGNOSIS — Z1231 Encounter for screening mammogram for malignant neoplasm of breast: Secondary | ICD-10-CM

## 2021-12-27 ENCOUNTER — Other Ambulatory Visit: Payer: Self-pay | Admitting: Family Medicine

## 2021-12-30 ENCOUNTER — Other Ambulatory Visit: Payer: Self-pay | Admitting: Family Medicine

## 2021-12-30 DIAGNOSIS — I10 Essential (primary) hypertension: Secondary | ICD-10-CM

## 2022-01-31 ENCOUNTER — Ambulatory Visit (INDEPENDENT_AMBULATORY_CARE_PROVIDER_SITE_OTHER): Payer: Medicare Other | Admitting: Family Medicine

## 2022-01-31 ENCOUNTER — Encounter: Payer: Self-pay | Admitting: Family Medicine

## 2022-01-31 VITALS — BP 140/70 | HR 75 | Temp 98.6°F | Ht 65.5 in | Wt 145.2 lb

## 2022-01-31 DIAGNOSIS — Z961 Presence of intraocular lens: Secondary | ICD-10-CM | POA: Diagnosis not present

## 2022-01-31 DIAGNOSIS — H524 Presbyopia: Secondary | ICD-10-CM | POA: Diagnosis not present

## 2022-01-31 DIAGNOSIS — H5203 Hypermetropia, bilateral: Secondary | ICD-10-CM | POA: Diagnosis not present

## 2022-01-31 DIAGNOSIS — I10 Essential (primary) hypertension: Secondary | ICD-10-CM | POA: Diagnosis not present

## 2022-01-31 DIAGNOSIS — H35372 Puckering of macula, left eye: Secondary | ICD-10-CM | POA: Diagnosis not present

## 2022-01-31 DIAGNOSIS — Z9849 Cataract extraction status, unspecified eye: Secondary | ICD-10-CM | POA: Diagnosis not present

## 2022-01-31 DIAGNOSIS — H52223 Regular astigmatism, bilateral: Secondary | ICD-10-CM | POA: Diagnosis not present

## 2022-01-31 NOTE — Progress Notes (Signed)
   Subjective:    Patient ID: Rachael Mitchell, female    DOB: 10/23/1945, 76 y.o.   MRN: [id number]  HPI Here to discuss her BP. She feels fine. Her BP at home the past few months has averaged in the 140s over 60s. She has had 3 systolic readings above 481.    Review of Systems  Constitutional: Negative.   Respiratory: Negative.    Cardiovascular: Negative.        Objective:   Physical Exam Constitutional:      Appearance: Normal appearance.  Cardiovascular:     Rate and Rhythm: Normal rate and regular rhythm.     Pulses: Normal pulses.     Heart sounds: Normal heart sounds.  Pulmonary:     Effort: Pulmonary effort is normal.     Breath sounds: Normal breath sounds.  Musculoskeletal:     Right lower leg: No edema.     Left lower leg: No edema.  Neurological:     Mental Status: She is alert.           Assessment & Plan:  Her HTN is well managed. I advised her to check back with [place] if her systolic BP begins to get above 150 very often.  Alysia Penna, MD

## 2022-03-29 ENCOUNTER — Other Ambulatory Visit: Payer: Self-pay | Admitting: Family Medicine

## 2022-03-29 DIAGNOSIS — I10 Essential (primary) hypertension: Secondary | ICD-10-CM

## 2022-04-07 DIAGNOSIS — R208 Other disturbances of skin sensation: Secondary | ICD-10-CM | POA: Diagnosis not present

## 2022-04-07 DIAGNOSIS — L821 Other seborrheic keratosis: Secondary | ICD-10-CM | POA: Diagnosis not present

## 2022-04-07 DIAGNOSIS — Z789 Other specified health status: Secondary | ICD-10-CM | POA: Diagnosis not present

## 2022-04-07 DIAGNOSIS — Z85828 Personal history of other malignant neoplasm of skin: Secondary | ICD-10-CM | POA: Diagnosis not present

## 2022-04-07 DIAGNOSIS — D1801 Hemangioma of skin and subcutaneous tissue: Secondary | ICD-10-CM | POA: Diagnosis not present

## 2022-04-07 DIAGNOSIS — L814 Other melanin hyperpigmentation: Secondary | ICD-10-CM | POA: Diagnosis not present

## 2022-04-07 DIAGNOSIS — L820 Inflamed seborrheic keratosis: Secondary | ICD-10-CM | POA: Diagnosis not present

## 2022-04-07 DIAGNOSIS — C44529 Squamous cell carcinoma of skin of other part of trunk: Secondary | ICD-10-CM | POA: Diagnosis not present

## 2022-04-07 DIAGNOSIS — D485 Neoplasm of uncertain behavior of skin: Secondary | ICD-10-CM | POA: Diagnosis not present

## 2022-04-07 DIAGNOSIS — Z08 Encounter for follow-up examination after completed treatment for malignant neoplasm: Secondary | ICD-10-CM | POA: Diagnosis not present

## 2022-04-12 ENCOUNTER — Ambulatory Visit (INDEPENDENT_AMBULATORY_CARE_PROVIDER_SITE_OTHER): Payer: Medicare Other | Admitting: Family Medicine

## 2022-04-12 ENCOUNTER — Encounter: Payer: Self-pay | Admitting: Family Medicine

## 2022-04-12 VITALS — BP 138/70 | HR 78 | Temp 99.0°F | Wt 144.4 lb

## 2022-04-12 DIAGNOSIS — J019 Acute sinusitis, unspecified: Secondary | ICD-10-CM

## 2022-04-12 MED ORDER — AZITHROMYCIN 250 MG PO TABS: ORAL_TABLET | ORAL | 0 refills | Status: DC

## 2022-04-12 MED ORDER — HYDROCODONE BIT-HOMATROP MBR 5-1.5 MG/5ML PO SOLN: 5.0000 mL | ORAL | 0 refills | Status: DC | PRN

## 2022-04-12 NOTE — Progress Notes (Signed)
   Subjective:    Patient ID: Rachael Mitchell, female    DOB: April 11, 1946, 76 y.o.   MRN: [id number]  HPI Here for one week of sinus congestion, PND, headaches, and a dry cough. No fever or SOB. She tested negative for the Covid-19 virus at home last night.    Review of Systems  Constitutional: Negative.   HENT:  Positive for congestion, postnasal drip, sinus pressure and sore throat. Negative for ear pain.   Eyes: Negative.   Respiratory:  Positive for cough. Negative for chest tightness, shortness of breath and wheezing.   Cardiovascular: Negative.   Gastrointestinal: Negative.        Objective:   Physical Exam Constitutional:      Appearance: Normal appearance. She is not ill-appearing.  HENT:     Right Ear: Tympanic membrane, ear canal and external ear normal.     Left Ear: Tympanic membrane, ear canal and external ear normal.     Nose: Nose normal.     Mouth/Throat:     Pharynx: Oropharynx is clear.  Eyes:     Conjunctiva/sclera: Conjunctivae normal.  Cardiovascular:     Rate and Rhythm: Normal rate and regular rhythm.     Pulses: Normal pulses.     Heart sounds: Normal heart sounds.  Pulmonary:     Effort: Pulmonary effort is normal.     Breath sounds: Normal breath sounds.  Neurological:     Mental Status: She is alert.           Assessment & Plan:  Sinusitis, treat with a Zpack. Use Hycodan as needed for cough.  Alysia Penna, MD

## 2022-05-08 ENCOUNTER — Other Ambulatory Visit: Payer: Self-pay | Admitting: Family Medicine

## 2022-05-08 DIAGNOSIS — E782 Mixed hyperlipidemia: Secondary | ICD-10-CM

## 2022-05-10 DIAGNOSIS — C44529 Squamous cell carcinoma of skin of other part of trunk: Secondary | ICD-10-CM | POA: Diagnosis not present

## 2022-05-10 DIAGNOSIS — L989 Disorder of the skin and subcutaneous tissue, unspecified: Secondary | ICD-10-CM | POA: Diagnosis not present

## 2022-05-11 ENCOUNTER — Other Ambulatory Visit: Payer: Self-pay | Admitting: Family Medicine

## 2022-05-11 DIAGNOSIS — E782 Mixed hyperlipidemia: Secondary | ICD-10-CM

## 2022-05-12 ENCOUNTER — Encounter: Payer: Self-pay | Admitting: Family Medicine

## 2022-05-12 ENCOUNTER — Other Ambulatory Visit: Payer: Self-pay

## 2022-05-12 DIAGNOSIS — E782 Mixed hyperlipidemia: Secondary | ICD-10-CM

## 2022-05-12 MED ORDER — ATORVASTATIN CALCIUM 20 MG PO TABS: 20.0000 mg | ORAL_TABLET | Freq: Every day | ORAL | 0 refills | Status: DC

## 2022-05-24 ENCOUNTER — Other Ambulatory Visit: Payer: Self-pay | Admitting: Family Medicine

## 2022-05-24 DIAGNOSIS — E782 Mixed hyperlipidemia: Secondary | ICD-10-CM

## 2022-05-25 ENCOUNTER — Other Ambulatory Visit: Payer: Self-pay

## 2022-05-25 DIAGNOSIS — E782 Mixed hyperlipidemia: Secondary | ICD-10-CM

## 2022-05-25 MED ORDER — ATORVASTATIN CALCIUM 20 MG PO TABS: 20.0000 mg | ORAL_TABLET | Freq: Every day | ORAL | 0 refills | Status: DC

## 2022-05-25 MED ORDER — ATORVASTATIN CALCIUM 20 MG PO TABS: 20.0000 mg | ORAL_TABLET | Freq: Every day | ORAL | 3 refills | Status: DC

## 2022-05-25 NOTE — Telephone Encounter (Signed)
Done

## 2022-05-25 NOTE — Telephone Encounter (Signed)
Dr. Sarajane Jews I've attempted to send this refill for this patient several times,  it will not send for some reason it defaults to Print.     My Chart message was sent to you also requesting this refill also.

## 2022-06-24 DIAGNOSIS — Z23 Encounter for immunization: Secondary | ICD-10-CM | POA: Diagnosis not present

## 2022-06-25 ENCOUNTER — Other Ambulatory Visit: Payer: Self-pay | Admitting: Family Medicine

## 2022-06-26 ENCOUNTER — Other Ambulatory Visit: Payer: Self-pay | Admitting: Family Medicine

## 2022-06-26 DIAGNOSIS — I10 Essential (primary) hypertension: Secondary | ICD-10-CM

## 2022-08-03 ENCOUNTER — Encounter: Payer: Self-pay | Admitting: Family Medicine

## 2022-08-03 ENCOUNTER — Ambulatory Visit (INDEPENDENT_AMBULATORY_CARE_PROVIDER_SITE_OTHER): Payer: Medicare Other | Admitting: Family Medicine

## 2022-08-03 VITALS — BP 128/72 | HR 70 | Temp 98.0°F | Ht 65.0 in | Wt 147.0 lb

## 2022-08-03 DIAGNOSIS — M159 Polyosteoarthritis, unspecified: Secondary | ICD-10-CM | POA: Diagnosis not present

## 2022-08-03 DIAGNOSIS — E782 Mixed hyperlipidemia: Secondary | ICD-10-CM | POA: Diagnosis not present

## 2022-08-03 DIAGNOSIS — M810 Age-related osteoporosis without current pathological fracture: Secondary | ICD-10-CM | POA: Diagnosis not present

## 2022-08-03 DIAGNOSIS — R739 Hyperglycemia, unspecified: Secondary | ICD-10-CM | POA: Diagnosis not present

## 2022-08-03 DIAGNOSIS — G43009 Migraine without aura, not intractable, without status migrainosus: Secondary | ICD-10-CM | POA: Diagnosis not present

## 2022-08-03 DIAGNOSIS — I10 Essential (primary) hypertension: Secondary | ICD-10-CM | POA: Diagnosis not present

## 2022-08-03 DIAGNOSIS — E559 Vitamin D deficiency, unspecified: Secondary | ICD-10-CM

## 2022-08-03 LAB — VITAMIN D 25 HYDROXY (VIT D DEFICIENCY, FRACTURES): VITD: 69.88 ng/mL (ref 30.00–100.00)

## 2022-08-03 LAB — LIPID PANEL
Cholesterol: 129 mg/dL (ref 0–200)
HDL: 48.2 mg/dL (ref >39.00)
LDL Cholesterol: 61 mg/dL (ref 0–99)
NonHDL: 81.06
Total CHOL/HDL Ratio: 3
Triglycerides: 98 mg/dL (ref 0.0–149.0)
VLDL: 19.6 mg/dL (ref 0.0–40.0)

## 2022-08-03 LAB — BASIC METABOLIC PANEL
BUN: 17 mg/dL (ref 6–23)
CO2: 29 mEq/L (ref 19–32)
Calcium: 10.4 mg/dL (ref 8.4–10.5)
Chloride: 102 mEq/L (ref 96–112)
Creatinine, Ser: 0.74 mg/dL (ref 0.40–1.20)
GFR: 78.11 mL/min (ref >60.00)
Glucose, Bld: 106 mg/dL — ABNORMAL HIGH (ref 70–99)
Potassium: 4.1 mEq/L (ref 3.5–5.1)
Sodium: 138 mEq/L (ref 135–145)

## 2022-08-03 LAB — CBC WITH DIFFERENTIAL/PLATELET
Basophils Absolute: 0.1 10*3/uL (ref 0.0–0.1)
Basophils Relative: 0.8 % (ref 0.0–3.0)
Eosinophils Absolute: 0.2 10*3/uL (ref 0.0–0.7)
Eosinophils Relative: 1.8 % (ref 0.0–5.0)
HCT: 39.5 % (ref 36.0–46.0)
Hemoglobin: 13.6 g/dL (ref 12.0–15.0)
Lymphocytes Relative: 42 % (ref 12.0–46.0)
Lymphs Abs: 3.8 10*3/uL (ref 0.7–4.0)
MCHC: 34.5 g/dL (ref 30.0–36.0)
MCV: 90 fl (ref 78.0–100.0)
Monocytes Absolute: 0.7 10*3/uL (ref 0.1–1.0)
Monocytes Relative: 7.6 % (ref 3.0–12.0)
Neutro Abs: 4.3 10*3/uL (ref 1.4–7.7)
Neutrophils Relative %: 47.8 % (ref 43.0–77.0)
Platelets: 463 10*3/uL — ABNORMAL HIGH (ref 150.0–400.0)
RBC: 4.39 Mil/uL (ref 3.87–5.11)
RDW: 12.3 % (ref 11.5–15.5)
WBC: 9.1 10*3/uL (ref 4.0–10.5)

## 2022-08-03 LAB — HEPATIC FUNCTION PANEL
ALT: 18 U/L (ref 0–35)
AST: 24 U/L (ref 0–37)
Albumin: 4.6 g/dL (ref 3.5–5.2)
Alkaline Phosphatase: 59 U/L (ref 39–117)
Bilirubin, Direct: 0.1 mg/dL (ref 0.0–0.3)
Total Bilirubin: 0.5 mg/dL (ref 0.2–1.2)
Total Protein: 7.7 g/dL (ref 6.0–8.3)

## 2022-08-03 LAB — HEMOGLOBIN A1C: Hgb A1c MFr Bld: 5.8 % (ref 4.6–6.5)

## 2022-08-03 LAB — TSH: TSH: 1.26 u[IU]/mL (ref 0.35–5.50)

## 2022-08-03 MED ORDER — AMLODIPINE BESYLATE 5 MG PO TABS: 5.0000 mg | ORAL_TABLET | Freq: Every day | ORAL | 3 refills | Status: DC

## 2022-08-03 MED ORDER — DICLOFENAC SODIUM 1 % EX GEL: 2.0000 g | Freq: Four times a day (QID) | CUTANEOUS | 5 refills | Status: AC

## 2022-08-03 MED ORDER — FENOFIBRATE 160 MG PO TABS: 160.0000 mg | ORAL_TABLET | Freq: Every day | ORAL | 3 refills | Status: DC

## 2022-08-03 MED ORDER — SUMATRIPTAN SUCCINATE 100 MG PO TABS: 100.0000 mg | ORAL_TABLET | Freq: Once | ORAL | 3 refills | Status: DC | PRN

## 2022-08-03 MED ORDER — ATORVASTATIN CALCIUM 20 MG PO TABS: 20.0000 mg | ORAL_TABLET | Freq: Every day | ORAL | 3 refills | Status: DC

## 2022-08-03 MED ORDER — TRIAMTERENE-HCTZ 37.5-25 MG PO TABS: 1.0000 | ORAL_TABLET | Freq: Every day | ORAL | 3 refills | Status: DC

## 2022-08-03 NOTE — Progress Notes (Signed)
   Subjective:    Patient ID: Rachael Mitchell, female    DOB: April 12, 1946, 76 y.o.   MRN: [id number]  HPI Here to follow up on issues. She has no particular complaints. Her BP is stable her OA is stable. Her migraines are less frequent lately and they respond well to the Sumatriptan.    Review of Systems  Constitutional: Negative.   HENT: Negative.    Eyes: Negative.   Respiratory: Negative.    Cardiovascular: Negative.   Gastrointestinal: Negative.   Genitourinary:  Negative for decreased urine volume, difficulty urinating, dyspareunia, dysuria, enuresis, flank pain, frequency, hematuria, pelvic pain and urgency.  Musculoskeletal:  Positive for arthralgias.  Skin: Negative.   Neurological: Negative.  Negative for headaches.  Psychiatric/Behavioral: Negative.         Objective:   Physical Exam Constitutional:      General: She is not in acute distress.    Appearance: Normal appearance. She is well-developed.  HENT:     Head: Normocephalic and atraumatic.     Right Ear: External ear normal.     Left Ear: External ear normal.     Nose: Nose normal.     Mouth/Throat:     Pharynx: No oropharyngeal exudate.  Eyes:     General: No scleral icterus.    Conjunctiva/sclera: Conjunctivae normal.     Pupils: Pupils are equal, round, and reactive to light.  Neck:     Thyroid: No thyromegaly.     Vascular: No JVD.  Cardiovascular:     Rate and Rhythm: Normal rate and regular rhythm.     Heart sounds: Normal heart sounds. No murmur heard.    No friction rub. No gallop.  Pulmonary:     Effort: Pulmonary effort is normal. No respiratory distress.     Breath sounds: Normal breath sounds. No wheezing or rales.  Chest:     Chest wall: No tenderness.  Abdominal:     General: Bowel sounds are normal. There is no distension.     Palpations: Abdomen is soft. There is no mass.     Tenderness: There is no abdominal tenderness. There is no guarding or rebound.  Musculoskeletal:         General: No tenderness. Normal range of motion.     Cervical back: Normal range of motion and neck supple.  Lymphadenopathy:     Cervical: No cervical adenopathy.  Skin:    General: Skin is warm and dry.     Findings: No erythema or rash.  Neurological:     Mental Status: She is alert and oriented to person, place, and time.     Cranial Nerves: No cranial nerve deficit.     Motor: No abnormal muscle tone.     Coordination: Coordination normal.     Deep Tendon Reflexes: Reflexes are normal and symmetric. Reflexes normal.  Psychiatric:        Behavior: Behavior normal.        Thought Content: Thought content normal.        Judgment: Judgment normal.           Assessment & Plan:  Her HTN is stable. Her OA and migraines are stable. We will get fasting labs today to check lipids, etc. She will be due for another colonoscopy in April. We will set up another DEXA soon. We spent a total of (35   ) minutes reviewing records and discussing these issues.  Alysia Penna, MD

## 2022-09-06 ENCOUNTER — Telehealth: Payer: Medicare Other

## 2022-10-11 ENCOUNTER — Encounter: Payer: Self-pay | Admitting: Family Medicine

## 2022-11-08 DIAGNOSIS — D225 Melanocytic nevi of trunk: Secondary | ICD-10-CM | POA: Diagnosis not present

## 2022-11-08 DIAGNOSIS — L814 Other melanin hyperpigmentation: Secondary | ICD-10-CM | POA: Diagnosis not present

## 2022-11-08 DIAGNOSIS — L821 Other seborrheic keratosis: Secondary | ICD-10-CM | POA: Diagnosis not present

## 2022-11-08 DIAGNOSIS — Z85828 Personal history of other malignant neoplasm of skin: Secondary | ICD-10-CM | POA: Diagnosis not present

## 2022-11-08 DIAGNOSIS — L2089 Other atopic dermatitis: Secondary | ICD-10-CM | POA: Diagnosis not present

## 2022-11-08 DIAGNOSIS — Z08 Encounter for follow-up examination after completed treatment for malignant neoplasm: Secondary | ICD-10-CM | POA: Diagnosis not present

## 2022-11-24 ENCOUNTER — Telehealth: Payer: Self-pay | Admitting: Family Medicine

## 2022-11-24 NOTE — Telephone Encounter (Signed)
Contacted Rachael Mitchell to schedule their annual wellness visit. Appointment made for 12/02/22.  Rachael Mitchell AWV direct phone # [phone]   R/s appt from 12/01/22 to 4/12 left message and sent my chart message with appt date/time change

## 2022-11-29 ENCOUNTER — Other Ambulatory Visit: Payer: Self-pay | Admitting: Family Medicine

## 2022-11-29 DIAGNOSIS — Z0000 Encounter for general adult medical examination without abnormal findings: Secondary | ICD-10-CM

## 2022-12-28 NOTE — Progress Notes (Signed)
   Subjective:    Tillie Rung, LPN   08/27/1094   Nurse Notes:      This encounter was created in error - please disregard.

## 2022-12-28 NOTE — Patient Instructions (Signed)
Rachael Mitchell , Thank you for taking time to come for your Medicare Wellness Visit. I appreciate your ongoing commitment to your health goals. Please review the following plan we discussed and let me know if I can assist you in the future.   These are the goals we discussed:  Goals       Increase physical activity (pt-stated)      Walk more.      Manage My Medicine      Timeframe:  Short-Term Goal Priority:  High Start Date:                             Expected End Date:                       Follow Up Date 07/06/21    - keep a list of all the medicines I take; vitamins and herbals too - use a pillbox to sort medicine - use an alarm clock or phone to remind me to take my medicine    Why is this important?   These steps will help you keep on track with your medicines.   Notes:         This is a list of the screening recommended for you and due dates:  Health Maintenance  Topic Date Due   Colon Cancer Screening  11/28/2022   Flu Shot  03/23/2023   Medicare Annual Wellness Visit  12/28/2023   DTaP/Tdap/Td vaccine (3 - Td or Tdap) 06/17/2025   Pneumonia Vaccine  Completed   DEXA scan (bone density measurement)  Completed   COVID-19 Vaccine  Completed   Hepatitis C Screening: USPSTF Recommendation to screen - Ages 16-79 yo.  Completed   Zoster (Shingles) Vaccine  Completed   HPV Vaccine  Aged Out    Advanced directives:   Conditions/risks identified: None  Next appointment: Follow up in one year for your annual wellness visit    Preventive Care 65 Years and Older, Female Preventive care refers to lifestyle choices and visits with your health care provider that can promote health and wellness. What does preventive care include? A yearly physical exam. This is also called an annual well check. Dental exams once or twice a year. Routine eye exams. Ask your health care provider how often you should have your eyes checked. Personal lifestyle choices, including: Daily care  of your teeth and gums. Regular physical activity. Eating a healthy diet. Avoiding tobacco and drug use. Limiting alcohol use. Practicing safe sex. Taking low-dose aspirin every day. Taking vitamin and mineral supplements as recommended by your health care provider. What happens during an annual well check? The services and screenings done by your health care provider during your annual well check will depend on your age, overall health, lifestyle risk factors, and family history of disease. Counseling  Your health care provider may ask you questions about your: Alcohol use. Tobacco use. Drug use. Emotional well-being. Home and relationship well-being. Sexual activity. Eating habits. History of falls. Memory and ability to understand (cognition). Work and work Astronomer. Reproductive health. Screening  You may have the following tests or measurements: Height, weight, and BMI. Blood pressure. Lipid and cholesterol levels. These may be checked every 5 years, or more frequently if you are over 11 years old. Skin check. Lung cancer screening. You may have this screening every year starting at age 64 if you have a 30-pack-year history of smoking and  currently smoke or have quit within the past 15 years. Fecal occult blood test (FOBT) of the stool. You may have this test every year starting at age 56. Flexible sigmoidoscopy or colonoscopy. You may have a sigmoidoscopy every 5 years or a colonoscopy every 10 years starting at age 58. Hepatitis C blood test. Hepatitis B blood test. Sexually transmitted disease (STD) testing. Diabetes screening. This is done by checking your blood sugar (glucose) after you have not eaten for a while (fasting). You may have this done every 1-3 years. Bone density scan. This is done to screen for osteoporosis. You may have this done starting at age 69. Mammogram. This may be done every 1-2 years. Talk to your health care provider about how often you  should have regular mammograms. Talk with your health care provider about your test results, treatment options, and if necessary, the need for more tests. Vaccines  Your health care provider may recommend certain vaccines, such as: Influenza vaccine. This is recommended every year. Tetanus, diphtheria, and acellular pertussis (Tdap, Td) vaccine. You may need a Td booster every 10 years. Zoster vaccine. You may need this after age 21. Pneumococcal 13-valent conjugate (PCV13) vaccine. One dose is recommended after age 68. Pneumococcal polysaccharide (PPSV23) vaccine. One dose is recommended after age 56. Talk to your health care provider about which screenings and vaccines you need and how often you need them. This information is not intended to replace advice given to you by your health care provider. Make sure you discuss any questions you have with your health care provider. Document Released: 09/04/2015 Document Revised: 04/27/2016 Document Reviewed: 06/09/2015 Elsevier Interactive Patient Education  2017 [employer].  Fall Prevention in the Home Falls can cause injuries. They can happen to people of all ages. There are many things you can do to make your home safe and to help prevent falls. What can I do on the outside of my home? Regularly fix the edges of walkways and driveways and fix any cracks. Remove anything that might make you trip as you walk through a door, such as a raised step or threshold. Trim any bushes or trees on the path to your home. Use bright outdoor lighting. Clear any walking paths of anything that might make someone trip, such as rocks or tools. Regularly check to see if handrails are loose or broken. Make sure that both sides of any steps have handrails. Any raised decks and porches should have guardrails on the edges. Have any leaves, snow, or ice cleared regularly. Use sand or salt on walking paths during winter. Clean up any spills in your garage right away.  This includes oil or grease spills. What can I do in the bathroom? Use night lights. Install grab bars by the toilet and in the tub and shower. Do not use towel bars as grab bars. Use non-skid mats or decals in the tub or shower. If you need to sit down in the shower, use a plastic, non-slip stool. Keep the floor dry. Clean up any water that spills on the floor as soon as it happens. Remove soap buildup in the tub or shower regularly. Attach bath mats securely with double-sided non-slip rug tape. Do not have throw rugs and other things on the floor that can make you trip. What can I do in the bedroom? Use night lights. Make sure that you have a light by your bed that is easy to reach. Do not use any sheets or blankets that are too  big for your bed. They should not hang down onto the floor. Have a firm chair that has side arms. You can use this for support while you get dressed. Do not have throw rugs and other things on the floor that can make you trip. What can I do in the kitchen? Clean up any spills right away. Avoid walking on wet floors. Keep items that you use a lot in easy-to-reach places. If you need to reach something above you, use a strong step stool that has a grab bar. Keep electrical cords out of the way. Do not use floor polish or wax that makes floors slippery. If you must use wax, use non-skid floor wax. Do not have throw rugs and other things on the floor that can make you trip. What can I do with my stairs? Do not leave any items on the stairs. Make sure that there are handrails on both sides of the stairs and use them. Fix handrails that are broken or loose. Make sure that handrails are as long as the stairways. Check any carpeting to make sure that it is firmly attached to the stairs. Fix any carpet that is loose or worn. Avoid having throw rugs at the top or bottom of the stairs. If you do have throw rugs, attach them to the floor with carpet tape. Make sure that  you have a light switch at the top of the stairs and the bottom of the stairs. If you do not have them, ask someone to add them for you. What else can I do to help prevent falls? Wear shoes that: Do not have high heels. Have rubber bottoms. Are comfortable and fit you well. Are closed at the toe. Do not wear sandals. If you use a stepladder: Make sure that it is fully opened. Do not climb a closed stepladder. Make sure that both sides of the stepladder are locked into place. Ask someone to hold it for you, if possible. Clearly mark and make sure that you can see: Any grab bars or handrails. First and last steps. Where the edge of each step is. Use tools that help you move around (mobility aids) if they are needed. These include: Canes. Walkers. Scooters. Crutches. Turn on the lights when you go into a dark area. Replace any light bulbs as soon as they burn out. Set up your furniture so you have a clear path. Avoid moving your furniture around. If any of your floors are uneven, fix them. If there are any pets around you, be aware of where they are. Review your medicines with your doctor. Some medicines can make you feel dizzy. This can increase your chance of falling. Ask your doctor what other things that you can do to help prevent falls. This information is not intended to replace advice given to you by your health care provider. Make sure you discuss any questions you have with your health care provider. Document Released: 06/04/2009 Document Revised: 01/14/2016 Document Reviewed: 09/12/2014 Elsevier Interactive Patient Education  2017 [employer].

## 2023-01-06 ENCOUNTER — Ambulatory Visit
Admission: RE | Admit: 2023-01-06 | Discharge: 2023-01-06 | Disposition: A | Discharge status: Home / Self Care | Payer: Medicare Other | Source: Ambulatory Visit | Attending: Family Medicine | Admitting: Family Medicine

## 2023-01-06 DIAGNOSIS — Z0000 Encounter for general adult medical examination without abnormal findings: Secondary | ICD-10-CM

## 2023-01-06 DIAGNOSIS — Z1231 Encounter for screening mammogram for malignant neoplasm of breast: Secondary | ICD-10-CM | POA: Diagnosis not present

## 2023-01-20 ENCOUNTER — Telehealth: Payer: Self-pay | Admitting: Internal Medicine

## 2023-01-20 NOTE — Telephone Encounter (Signed)
Ok for previsit appt to schedule surveillance colonoscopy without OV

## 2023-01-20 NOTE — Telephone Encounter (Signed)
Hi Dr. Rhea Belton,    Patient called to schedule recall colonoscopy for 11/2022. Since she is the age of 41, would you like to schedule her colonoscopy directly? Or would like to schedule a office visit first. Please advise.    Thank you.

## 2023-01-23 ENCOUNTER — Encounter: Payer: Self-pay | Admitting: Internal Medicine

## 2023-01-23 NOTE — Telephone Encounter (Signed)
Called patient, left voicemail to call back and schedule.

## 2023-02-02 ENCOUNTER — Encounter: Payer: Self-pay | Admitting: Family Medicine

## 2023-02-02 ENCOUNTER — Ambulatory Visit (INDEPENDENT_AMBULATORY_CARE_PROVIDER_SITE_OTHER): Payer: Medicare Other | Admitting: Family Medicine

## 2023-02-02 VITALS — BP 128/64 | HR 74 | Temp 98.2°F | Wt 144.4 lb

## 2023-02-02 DIAGNOSIS — I10 Essential (primary) hypertension: Secondary | ICD-10-CM | POA: Diagnosis not present

## 2023-02-02 NOTE — Progress Notes (Signed)
   Subjective:    Patient ID: Rachael Mitchell, female    DOB: 11/30/1945, 77 y.o.   MRN: [id number]  HPI Here to follow up on HTN. She feels great and her BP has been stable.    Review of Systems  Constitutional: Negative.   Respiratory: Negative.    Cardiovascular: Negative.        Objective:   Physical Exam Constitutional:      Appearance: Normal appearance.  Cardiovascular:     Rate and Rhythm: Normal rate and regular rhythm.     Pulses: Normal pulses.     Heart sounds: Normal heart sounds.  Pulmonary:     Effort: Pulmonary effort is normal.     Breath sounds: Normal breath sounds.  Neurological:     Mental Status: She is alert.           Assessment & Plan:  HTN, stable. Continue the current regimen.  Gershon Crane, MD

## 2023-02-28 ENCOUNTER — Ambulatory Visit (AMBULATORY_SURGERY_CENTER): Payer: Medicare Other

## 2023-02-28 VITALS — Ht 65.0 in | Wt 145.0 lb

## 2023-02-28 DIAGNOSIS — Z86010 Personal history of colonic polyps: Secondary | ICD-10-CM

## 2023-02-28 MED ORDER — NA SULFATE-K SULFATE-MG SULF 17.5-3.13-1.6 GM/177ML PO SOLN: 1.0000 | Freq: Once | ORAL | 0 refills | Status: AC

## 2023-02-28 NOTE — Progress Notes (Signed)
No egg or soy allergy known to patient  No issues known to pt with past sedation with any surgeries or procedures Patient denies ever being told they had issues or difficulty with intubation  No FH of Malignant Hyperthermia Pt is not on diet pills Pt is not on  home 02  Pt is not on blood thinners  Pt denies issues with constipation  No A fib or A flutter Have any cardiac testing pending--no Patient's chart reviewed by John Nulty CNRA prior to previsit and patient appropriate for the [facility].  Previsit completed and red dot placed by patient's name on their procedure day (on provider's schedule).    Ambulates independently Pt instructed to use [email or web address] or GoodRx for a price reduction on prep   

## 2023-03-20 ENCOUNTER — Ambulatory Visit (AMBULATORY_SURGERY_CENTER): Payer: Medicare Other | Admitting: Internal Medicine

## 2023-03-20 ENCOUNTER — Encounter: Payer: Self-pay | Admitting: Internal Medicine

## 2023-03-20 VITALS — BP 118/66 | HR 68 | Temp 98.7°F | Resp 15 | Ht 65.0 in | Wt 145.0 lb

## 2023-03-20 DIAGNOSIS — Z86010 Personal history of colonic polyps: Secondary | ICD-10-CM | POA: Diagnosis not present

## 2023-03-20 DIAGNOSIS — Z09 Encounter for follow-up examination after completed treatment for conditions other than malignant neoplasm: Secondary | ICD-10-CM

## 2023-03-20 DIAGNOSIS — I10 Essential (primary) hypertension: Secondary | ICD-10-CM | POA: Diagnosis not present

## 2023-03-20 HISTORY — PX: OTHER SURGICAL HISTORY: SHX169

## 2023-03-20 MED ORDER — SODIUM CHLORIDE 0.9 % IV SOLN
500.0000 mL | Freq: Once | INTRAVENOUS | Status: DC
Start: 2023-03-20 — End: 2023-03-20

## 2023-03-20 NOTE — Op Note (Signed)
[facility] Patient Name: Rachael Mitchell Procedure Date: 03/20/2023 2:10 PM MRN: [id number] Endoscopist: Beverley Fiedler , MD, [id number] Age: 77 Referring MD:  Date of Birth: 12-25-1945 Gender: Female Account #: [id number] Procedure:                Colonoscopy Indications:              High risk colon cancer surveillance: Personal                            history of multiple adenomas, Last colonoscopy:                            April 2019 (distal hyperplastic x 1 only); Jan 2016                            (adenoma x 5, SSP x 1) Medicines:                Monitored Anesthesia Care Procedure:                Pre-Anesthesia Assessment:                           - Prior to the procedure, a History and Physical                            was performed, and patient medications and                            allergies were reviewed. The patient's tolerance of                            previous anesthesia was also reviewed. The risks                            and benefits of the procedure and the sedation                            options and risks were discussed with the patient.                            All questions were answered, and informed consent                            was obtained. Prior Anticoagulants: The patient has                            taken no anticoagulant or antiplatelet agents. ASA                            Grade Assessment: II - A patient with mild systemic                            disease. After reviewing the risks and benefits,  the patient was deemed in satisfactory condition to                            undergo the procedure.                           After obtaining informed consent, the colonoscope                            was passed under direct vision. Throughout the                            procedure, the patient's blood pressure, pulse, and                            oxygen saturations were monitored  continuously. The                            Olympus Scope [id number] was introduced through the                            anus and advanced to the cecum, identified by                            appendiceal orifice and ileocecal valve. The                            colonoscopy was performed without difficulty. The                            patient tolerated the procedure well. The quality                            of the bowel preparation was good. The ileocecal                            valve, appendiceal orifice, and rectum were                            photographed. Scope In: 2:19:47 PM Scope Out: 2:32:54 PM Scope Withdrawal Time: 0 hours 10 minutes 12 seconds  Total Procedure Duration: 0 hours 13 minutes 7 seconds  Findings:                 The digital rectal exam was normal.                           Multiple large-mouthed, medium-mouthed and                            small-mouthed diverticula were found in the sigmoid                            colon and descending colon.  The exam was otherwise without abnormality on                            direct and retroflexion views. Complications:            No immediate complications. Estimated Blood Loss:     Estimated blood loss: none. Impression:               - Moderate diverticulosis in the sigmoid colon and                            in the descending colon.                           - The examination was otherwise normal on direct                            and retroflexion views.                           - No specimens collected. Recommendation:           - Patient has a contact number available for                            emergencies. The signs and symptoms of potential                            delayed complications were discussed with the                            patient. Return to normal activities tomorrow.                            Written discharge instructions were provided to the                             patient.                           - Resume previous diet.                           - Continue present medications.                           - No repeat colonoscopy due to age at next                            surveillance interval and the absence of colonic                            polyps on today's exam. Beverley Fiedler, MD 03/20/2023 2:36:41 PM This report has been signed electronically.

## 2023-03-20 NOTE — Progress Notes (Signed)
GASTROENTEROLOGY PROCEDURE H&P NOTE   Primary Care Physician: Nelwyn Salisbury, MD    Reason for Procedure:  History of colon polyps  Plan:    Colonoscopy  Patient is appropriate for endoscopic procedure(s) in the ambulatory ([facility]) setting.  The nature of the procedure, as well as the risks, benefits, and alternatives were carefully and thoroughly reviewed with the patient. Ample time for discussion and questions allowed. The patient understood, was satisfied, and agreed to proceed.     HPI: Rachael Mitchell is a 77 y.o. female who presents for surveillance colonoscopy.  Medical history as below.  Tolerated the prep.  No recent chest pain or shortness of breath.  No abdominal pain today.  Past Medical History:  Diagnosis Date   Allergy    Arthritis    Cancer (HCC)    basal cell CA- face and back   Hyperlipidemia    Hypertension    Migraine    Osteopenia    last DEXA 06-09-11   Vitamin D deficiency     Past Surgical History:  Procedure Laterality Date   BASAL CELL CARCINOMA EXCISION     face back   CATARACT EXTRACTION, BILATERAL     colonoscopy  11/27/2017   per Dr. Rhea Belton, benign polyps, repeat in 5 yrs (hx of adenomas)    polpectomy  2007   colon-in [place].   POLYPECTOMY      Prior to Admission medications   Medication Sig Start Date End Date Taking? Authorizing Provider  amLODipine (NORVASC) 5 MG tablet Take 1 tablet (5 mg total) by mouth daily. 08/03/22  Yes Nelwyn Salisbury, MD  atorvastatin (LIPITOR) 20 MG tablet Take 1 tablet (20 mg total) by mouth daily. 08/03/22  Yes Nelwyn Salisbury, MD  cholecalciferol (VITAMIN D) 1000 UNITS tablet Take 2,000 Units by mouth daily. Take 2   Yes [provider]  fenofibrate 160 MG tablet Take 1 tablet (160 mg total) by mouth daily. 08/03/22  Yes Nelwyn Salisbury, MD  fish oil-omega-3 fatty acids 1000 MG capsule Take 1,200 mg by mouth daily.   Yes [provider]  glucosamine-chondroitin 500-400 MG tablet Take 1  tablet by mouth daily. Takes 1500-1200 mg 1 tablet daily   Yes [provider]  Levocetirizine Dihydrochloride (XYZAL PO) Take by mouth. OTC   Yes [provider]  Multiple Vitamin (MULTIVITAMIN) tablet Take 1 tablet by mouth daily.   Yes [provider]  triamterene-hydrochlorothiazide (MAXZIDE-25) 37.5-25 MG tablet Take 1 tablet by mouth daily. 08/03/22  Yes Nelwyn Salisbury, MD  Ascorbic Acid (VITAMIN C) 500 MG tablet Take 500 mg by mouth 2 (two) times daily.    [provider]  Calcium Carbonate-Vitamin D 600-400 MG-UNIT tablet Take 1 tablet by mouth 2 (two) times daily.    [provider]  desonide (DESOWEN) 0.05 % cream Apply 1 Application topically as needed. Patient not taking: Reported on 02/28/2023 11/08/22   [provider]  diclofenac Sodium (VOLTAREN) 1 % GEL Apply 2 g topically 4 (four) times daily. 08/03/22   Nelwyn Salisbury, MD  HYDROcodone bit-homatropine (HYCODAN) 5-1.5 MG/5ML syrup Take 5 mLs by mouth every 4 (four) hours as needed for cough. Patient not taking: Reported on 02/28/2023 04/12/22   Nelwyn Salisbury, MD  SUMAtriptan (IMITREX) 100 MG tablet Take 1 tablet (100 mg total) by mouth once as needed. 08/03/22   Nelwyn Salisbury, MD  triamcinolone (KENALOG) 0.025 % cream Apply 1 Application topically as needed. 02/01/23  [provider]    Current Outpatient Medications  Medication Sig Dispense Refill   amLODipine (NORVASC) 5 MG tablet Take 1 tablet (5 mg total) by mouth daily. 90 tablet 3   atorvastatin (LIPITOR) 20 MG tablet Take 1 tablet (20 mg total) by mouth daily. 90 tablet 3   cholecalciferol (VITAMIN D) 1000 UNITS tablet Take 2,000 Units by mouth daily. Take 2     fenofibrate 160 MG tablet Take 1 tablet (160 mg total) by mouth daily. 90 tablet 3   fish oil-omega-3 fatty acids 1000 MG capsule Take 1,200 mg by mouth daily.     glucosamine-chondroitin 500-400 MG tablet Take 1 tablet by mouth daily. Takes 1500-1200 mg  1 tablet daily     Levocetirizine Dihydrochloride (XYZAL PO) Take by mouth. OTC     Multiple Vitamin (MULTIVITAMIN) tablet Take 1 tablet by mouth daily.     triamterene-hydrochlorothiazide (MAXZIDE-25) 37.5-25 MG tablet Take 1 tablet by mouth daily. 90 tablet 3   Ascorbic Acid (VITAMIN C) 500 MG tablet Take 500 mg by mouth 2 (two) times daily.     Calcium Carbonate-Vitamin D 600-400 MG-UNIT tablet Take 1 tablet by mouth 2 (two) times daily.     desonide (DESOWEN) 0.05 % cream Apply 1 Application topically as needed. (Patient not taking: Reported on 02/28/2023)     diclofenac Sodium (VOLTAREN) 1 % GEL Apply 2 g topically 4 (four) times daily. 350 g 5   HYDROcodone bit-homatropine (HYCODAN) 5-1.5 MG/5ML syrup Take 5 mLs by mouth every 4 (four) hours as needed for cough. (Patient not taking: Reported on 02/28/2023) 240 mL 0   SUMAtriptan (IMITREX) 100 MG tablet Take 1 tablet (100 mg total) by mouth once as needed. 30 tablet 3   triamcinolone (KENALOG) 0.025 % cream Apply 1 Application topically as needed.     Current Facility-Administered Medications  Medication Dose Route Frequency Provider Last Rate Last Admin   0.9 %  sodium chloride infusion  500 mL Intravenous Once [place], Carie Caddy, MD        Allergies as of 03/20/2023   (No Known Allergies)    Family History  Problem Relation Age of Onset   Heart failure Brother    Diabetes Father    Arthritis Other    Coronary artery disease Other    Hypertension Other    Lung cancer Other    Osteoporosis Other    Heart failure Other    Colon cancer Neg Hx    Esophageal cancer Neg Hx    Stomach cancer Neg Hx    Rectal cancer Neg Hx     Social History   Socioeconomic History   Marital status: Single    Spouse name: Not on file   Number of children: Not on file   Years of education: Not on file   Highest education level: 12th grade  Occupational History   Not on file  Tobacco Use   Smoking status: Former    Current packs/day: 0.00     Types: Cigarettes    Quit date: 09/24/1992    Years since quitting: 30.5   Smokeless tobacco: Never  Vaping Use   Vaping status: Never Used  Substance and Sexual Activity   Alcohol use: Yes    Alcohol/week: 3.0 standard drinks of alcohol    Types: 3 Standard drinks or equivalent per week    Comment: 3 glasses of wine a week   Drug use: No   Sexual activity: Not on file  Other Topics  Concern   Not on file  Social History Narrative   Not on file   Social Determinants of Health   Financial Resource Strain: Low Risk  (02/01/2023)   Overall Financial Resource Strain (CARDIA)    Difficulty of Paying Living Expenses: Not hard at all  Food Insecurity: No Food Insecurity (02/01/2023)   Hunger Vital Sign    Worried About Running Out of Food in the Last Year: Never true    Ran Out of Food in the Last Year: Never true  Transportation Needs: No Transportation Needs (02/01/2023)   PRAPARE - Administrator, Civil Service (Medical): No    Lack of Transportation (Non-Medical): No  Physical Activity: Insufficiently Active (02/01/2023)   Exercise Vital Sign    Days of Exercise per Week: 4 days    Minutes of Exercise per Session: 30 min  Stress: No Stress Concern Present (02/01/2023)   [employer] of Occupational Health - Occupational Stress Questionnaire    Feeling of Stress : Not at all  Social Connections: Moderately Integrated (02/01/2023)   Social Connection and Isolation Panel [NHANES]    Frequency of Communication with Friends and Family: More than three times a week    Frequency of Social Gatherings with Friends and Family: Three times a week    Attends Religious Services: More than 4 times per year    Active Member of Clubs or Organizations: Yes    Attends Banker Meetings: More than 4 times per year    Marital Status: Never married  Intimate Partner Violence: Not At Risk (11/29/2021)   Humiliation, Afraid, Rape, and Kick questionnaire    Fear of Current or  Ex-Partner: No    Emotionally Abused: No    Physically Abused: No    Sexually Abused: No    Physical Exam: Vital signs in last 24 hours: @BP  (!) 151/62   Pulse 68   Temp 98.7 F (37.1 C) (Temporal)   Ht 5\' 5"  (1.651 m)   Wt 145 lb (65.8 kg)   SpO2 97%   BMI 24.13 kg/m  GEN: NAD EYE: Sclerae anicteric ENT: MMM CV: Non-tachycardic Pulm: CTA b/l GI: Soft, NT/ND NEURO:  Alert & Oriented x 3   Erick Blinks, MD [facility]  03/20/2023 2:13 PM

## 2023-03-20 NOTE — Patient Instructions (Addendum)
-   Return to normal activities tomorrow.  - Written discharge instructions were provided to the patient. - Resume previous diet. - Continue present medications. - No repeat colonoscopy due to age at next surveillance interval and the absence of colonic polyps on today's exam.   YOU HAD AN ENDOSCOPIC PROCEDURE TODAY AT THE [facility]:   Refer to the procedure report that was given to you for any specific questions about what was found during the examination.  If the procedure report does not answer your questions, please call your gastroenterologist to clarify.  If you requested that your care partner not be given the details of your procedure findings, then the procedure report has been included in a sealed envelope for you to review at your convenience later.  YOU SHOULD EXPECT: Some feelings of bloating in the abdomen. Passage of more gas than usual.  Walking can help get rid of the air that was put into your GI tract during the procedure and reduce the bloating. If you had a lower endoscopy (such as a colonoscopy or flexible sigmoidoscopy) you may notice spotting of blood in your stool or on the toilet paper. If you underwent a bowel prep for your procedure, you may not have a normal bowel movement for a few days.  Please Note:  You might notice some irritation and congestion in your nose or some drainage.  This is from the oxygen used during your procedure.  There is no need for concern and it should clear up in a day or so.  SYMPTOMS TO REPORT IMMEDIATELY:  Following lower endoscopy (colonoscopy or flexible sigmoidoscopy):  Excessive amounts of blood in the stool  Significant tenderness or worsening of abdominal pains  Swelling of the abdomen that is new, acute  Fever of 100F or higher  For urgent or emergent issues, a gastroenterologist can be reached at any hour by calling [phone]. Do not use MyChart messaging for urgent concerns.    DIET:  We do recommend a small  meal at first, but then you may proceed to your regular diet.  Drink plenty of fluids but you should avoid alcoholic beverages for 24 hours.  ACTIVITY:  You should plan to take it easy for the rest of today and you should NOT DRIVE or use heavy machinery until tomorrow (because of the sedation medicines used during the test).    FOLLOW UP: Our staff will call the number listed on your records the next business day following your procedure.  We will call around 7:15- 8:00 am to check on you and address any questions or concerns that you may have regarding the information given to you following your procedure. If we do not reach you, we will leave a message.     If any biopsies were taken you will be contacted by phone or by letter within the next 1-3 weeks.  Please call us at [phone] if you have not heard about the biopsies in 3 weeks.    SIGNATURES/CONFIDENTIALITY: You and/or your care partner have signed paperwork which will be entered into your electronic medical record.  These signatures attest to the fact that that the information above on your After Visit Summary has been reviewed and is understood.  Full responsibility of the confidentiality of this discharge information lies with you and/or your care-partner.

## 2023-03-20 NOTE — Progress Notes (Signed)
Uneventful anesthetic. Report to pacu rn. Vss. Care resumed by rn. 

## 2023-03-20 NOTE — Progress Notes (Signed)
VS completed by SM.  Pt's states no medical or surgical changes since previsit or office visit.  

## 2023-03-21 ENCOUNTER — Telehealth: Payer: Self-pay

## 2023-03-21 NOTE — Telephone Encounter (Signed)
  Follow up Call-     03/20/2023    1:38 PM  Call back number  Post procedure Call Back phone  # [phone]  Permission to leave phone message Yes     Patient questions:  Do you have a fever, pain , or abdominal swelling? No. Pain Score  0 *  Have you tolerated food without any problems? Yes.    Have you been able to return to your normal activities? Yes.    Do you have any questions about your discharge instructions: Diet   No. Medications  No. Follow up visit  No.  Do you have questions or concerns about your Care? No.  Actions: * If pain score is 4 or above: No action needed, pain <4.

## 2023-04-03 DIAGNOSIS — Z9849 Cataract extraction status, unspecified eye: Secondary | ICD-10-CM | POA: Diagnosis not present

## 2023-04-03 DIAGNOSIS — H524 Presbyopia: Secondary | ICD-10-CM | POA: Diagnosis not present

## 2023-04-03 DIAGNOSIS — H35372 Puckering of macula, left eye: Secondary | ICD-10-CM | POA: Diagnosis not present

## 2023-04-03 DIAGNOSIS — Z961 Presence of intraocular lens: Secondary | ICD-10-CM | POA: Diagnosis not present

## 2023-04-03 DIAGNOSIS — H5203 Hypermetropia, bilateral: Secondary | ICD-10-CM | POA: Diagnosis not present

## 2023-04-03 DIAGNOSIS — H53143 Visual discomfort, bilateral: Secondary | ICD-10-CM | POA: Diagnosis not present

## 2023-04-03 DIAGNOSIS — H52223 Regular astigmatism, bilateral: Secondary | ICD-10-CM | POA: Diagnosis not present

## 2023-04-03 DIAGNOSIS — H43393 Other vitreous opacities, bilateral: Secondary | ICD-10-CM | POA: Diagnosis not present

## 2023-04-06 ENCOUNTER — Encounter (INDEPENDENT_AMBULATORY_CARE_PROVIDER_SITE_OTHER): Payer: Self-pay

## 2023-04-10 DIAGNOSIS — L814 Other melanin hyperpigmentation: Secondary | ICD-10-CM | POA: Diagnosis not present

## 2023-04-10 DIAGNOSIS — L821 Other seborrheic keratosis: Secondary | ICD-10-CM | POA: Diagnosis not present

## 2023-04-10 DIAGNOSIS — L298 Other pruritus: Secondary | ICD-10-CM | POA: Diagnosis not present

## 2023-04-10 DIAGNOSIS — Z789 Other specified health status: Secondary | ICD-10-CM | POA: Diagnosis not present

## 2023-04-10 DIAGNOSIS — L538 Other specified erythematous conditions: Secondary | ICD-10-CM | POA: Diagnosis not present

## 2023-04-10 DIAGNOSIS — Z85828 Personal history of other malignant neoplasm of skin: Secondary | ICD-10-CM | POA: Diagnosis not present

## 2023-04-10 DIAGNOSIS — Z08 Encounter for follow-up examination after completed treatment for malignant neoplasm: Secondary | ICD-10-CM | POA: Diagnosis not present

## 2023-04-10 DIAGNOSIS — D225 Melanocytic nevi of trunk: Secondary | ICD-10-CM | POA: Diagnosis not present

## 2023-04-10 DIAGNOSIS — L820 Inflamed seborrheic keratosis: Secondary | ICD-10-CM | POA: Diagnosis not present

## 2023-04-10 DIAGNOSIS — R208 Other disturbances of skin sensation: Secondary | ICD-10-CM | POA: Diagnosis not present

## 2023-05-01 ENCOUNTER — Encounter: Payer: Self-pay | Admitting: Family Medicine

## 2023-05-09 DIAGNOSIS — Z23 Encounter for immunization: Secondary | ICD-10-CM | POA: Diagnosis not present

## 2023-05-10 ENCOUNTER — Ambulatory Visit (INDEPENDENT_AMBULATORY_CARE_PROVIDER_SITE_OTHER): Payer: Medicare Other

## 2023-05-10 VITALS — BP 124/62 | HR 75 | Temp 98.1°F | Ht 65.0 in | Wt 149.0 lb

## 2023-05-10 DIAGNOSIS — Z0000 Encounter for general adult medical examination without abnormal findings: Secondary | ICD-10-CM | POA: Diagnosis not present

## 2023-05-10 NOTE — Patient Instructions (Addendum)
Ms. Staal , Thank you for taking time to come for your Medicare Wellness Visit. I appreciate your ongoing commitment to your health goals. Please review the following plan we discussed and let me know if I can assist you in the future.   Referrals/Orders/Follow-Ups/Clinician Recommendations:  This is a list of the screening recommended for you and due dates:  Health Maintenance  Topic Date Due   COVID-19 Vaccine (7 - 2023-24 season) 04/23/2023   Medicare Annual Wellness Visit  05/09/2024   DTaP/Tdap/Td vaccine (3 - Td or Tdap) 06/17/2025   Pneumonia Vaccine  Completed   Flu Shot  Completed   DEXA scan (bone density measurement)  Completed   Hepatitis C Screening  Completed   Zoster (Shingles) Vaccine  Completed   HPV Vaccine  Aged Out   Colon Cancer Screening  Discontinued    Advanced directives: (Copy Requested) Please bring a copy of your health care power of attorney and living will to the office to be added to your chart at your convenience.  Next Medicare Annual Wellness Visit scheduled for next year: Yes

## 2023-05-10 NOTE — Progress Notes (Signed)
Subjective:   Rachael Mitchell is a 77 y.o. female who presents for Medicare Annual (Subsequent) preventive examination.  Visit Complete: In person  Patient Medicare AWV questionnaire was completed by the patient on 05/09/23; I have confirmed that all information answered by patient is correct and no changes since this date.  Cardiac Risk Factors include: hypertension;advanced age (>43men, >25 women)     Objective:    Today's Vitals   05/10/23 1058  BP: 124/62  Pulse: 75  Temp: 98.1 F (36.7 C)  TempSrc: Oral  SpO2: 97%  Weight: 149 lb (67.6 kg)  Height: 5\' 5"  (1.651 m)   Body mass index is 24.79 kg/m.     05/10/2023   11:09 AM 11/29/2021   12:53 PM 11/27/2017    7:23 AM  Advanced Directives  Does Patient Have a Medical Advance Directive? Yes Yes Yes  Type of Estate agent of [place];Living will Healthcare Power of [place];Living will Healthcare Power of [place];Living will;Out of facility DNR (pink MOST or yellow form)  Does patient want to make changes to medical advance directive?  No - Patient declined   Copy of Healthcare Power of Attorney in Chart? No - copy requested No - copy requested No - copy requested    Current Medications (verified) Outpatient Encounter Medications as of 05/10/2023  Medication Sig   amLODipine (NORVASC) 5 MG tablet Take 1 tablet (5 mg total) by mouth daily.   Ascorbic Acid (VITAMIN C) 500 MG tablet Take 500 mg by mouth 2 (two) times daily.   atorvastatin (LIPITOR) 20 MG tablet Take 1 tablet (20 mg total) by mouth daily.   Calcium Carbonate-Vitamin D 600-400 MG-UNIT tablet Take 1 tablet by mouth 2 (two) times daily.   cholecalciferol (VITAMIN D) 1000 UNITS tablet Take 2,000 Units by mouth daily. Take 2   desonide (DESOWEN) 0.05 % cream Apply 1 Application topically as needed. (Patient not taking: Reported on 02/28/2023)   diclofenac Sodium (VOLTAREN) 1 % GEL Apply 2 g topically 4 (four) times daily.   fenofibrate 160 MG  tablet Take 1 tablet (160 mg total) by mouth daily.   fish oil-omega-3 fatty acids 1000 MG capsule Take 1,200 mg by mouth daily.   glucosamine-chondroitin 500-400 MG tablet Take 1 tablet by mouth daily. Takes 1500-1200 mg 1 tablet daily   HYDROcodone bit-homatropine (HYCODAN) 5-1.5 MG/5ML syrup Take 5 mLs by mouth every 4 (four) hours as needed for cough. (Patient not taking: Reported on 02/28/2023)   Levocetirizine Dihydrochloride (XYZAL PO) Take by mouth. OTC   Multiple Vitamin (MULTIVITAMIN) tablet Take 1 tablet by mouth daily.   SUMAtriptan (IMITREX) 100 MG tablet Take 1 tablet (100 mg total) by mouth once as needed.   triamcinolone (KENALOG) 0.025 % cream Apply 1 Application topically as needed.   triamterene-hydrochlorothiazide (MAXZIDE-25) 37.5-25 MG tablet Take 1 tablet by mouth daily.   No facility-administered encounter medications on file as of 05/10/2023.    Allergies (verified) Patient has no known allergies.   History: Past Medical History:  Diagnosis Date   Allergy    Arthritis    Cancer (HCC)    basal cell CA- face and back   Hyperlipidemia    Hypertension    Migraine    Osteopenia    last DEXA 06-09-11   Vitamin D deficiency    Past Surgical History:  Procedure Laterality Date   BASAL CELL CARCINOMA EXCISION     face back   CATARACT EXTRACTION, BILATERAL     colonoscopy  11/27/2017   per Dr. Rhea Belton, benign polyps, repeat in 5 yrs (hx of adenomas)    polpectomy  2007   colon-in [place].   POLYPECTOMY     Family History  Problem Relation Age of Onset   Heart failure Brother    Diabetes Father    Arthritis Other    Coronary artery disease Other    Hypertension Other    Lung cancer Other    Osteoporosis Other    Heart failure Other    Colon cancer Neg Hx    Esophageal cancer Neg Hx    Stomach cancer Neg Hx    Rectal cancer Neg Hx    Social History   Socioeconomic History   Marital status: Single    Spouse name: Not on file   Number of children: Not on  file   Years of education: Not on file   Highest education level: 12th grade  Occupational History   Not on file  Tobacco Use   Smoking status: Former    Current packs/day: 0.00    Types: Cigarettes    Quit date: 09/24/1992    Years since quitting: 30.6   Smokeless tobacco: Never  Vaping Use   Vaping status: Never Used  Substance and Sexual Activity   Alcohol use: Yes    Alcohol/week: 3.0 standard drinks of alcohol    Types: 3 Standard drinks or equivalent per week    Comment: 3 glasses of wine a week   Drug use: No   Sexual activity: Not on file  Other Topics Concern   Not on file  Social History Narrative   Not on file   Social Determinants of Health   Financial Resource Strain: Low Risk  (05/09/2023)   Overall Financial Resource Strain (CARDIA)    Difficulty of Paying Living Expenses: Not hard at all  Food Insecurity: No Food Insecurity (05/09/2023)   Hunger Vital Sign    Worried About Running Out of Food in the Last Year: Never true    Ran Out of Food in the Last Year: Never true  Transportation Needs: No Transportation Needs (05/09/2023)   PRAPARE - Administrator, Civil Service (Medical): No    Lack of Transportation (Non-Medical): No  Physical Activity: Sufficiently Active (05/09/2023)   Exercise Vital Sign    Days of Exercise per Week: 3 days    Minutes of Exercise per Session: 50 min  Stress: No Stress Concern Present (05/09/2023)   [employer] of Occupational Health - Occupational Stress Questionnaire    Feeling of Stress : Not at all  Social Connections: Moderately Integrated (05/09/2023)   Social Connection and Isolation Panel [NHANES]    Frequency of Communication with Friends and Family: Three times a week    Frequency of Social Gatherings with Friends and Family: More than three times a week    Attends Religious Services: More than 4 times per year    Active Member of Clubs or Organizations: Yes    Attends Engineer, structural: More  than 4 times per year    Marital Status: Never married    Tobacco Counseling Counseling given: Not Answered   Clinical Intake:  Pre-visit preparation completed: Yes  Pain : No/denies pain     BMI - recorded: 24.79 Nutritional Status: BMI of 19-24  Normal Nutritional Risks: None Diabetes: No  How often do you need to have someone help you when you read instructions, pamphlets, or other written materials from your doctor or pharmacy?:  2 - Rarely  Interpreter Needed?: No  Information entered by :: Theresa Mulligan LPN   Activities of Daily Living    05/09/2023    1:56 PM 12/28/2022   10:59 AM  In your present state of health, do you have any difficulty performing the following activities:  Hearing? 0 0  Vision? 0 0  Difficulty concentrating or making decisions? 0 0  Walking or climbing stairs? 0 0  Dressing or bathing? 0 0  Doing errands, shopping? 0 0  Preparing Food and eating ? N N  Using the Toilet? N N  In the past six months, have you accidently leaked urine? N Y  Do you have problems with loss of bowel control? N N  Managing your Medications? N N  Managing your Finances? N N  Housekeeping or managing your Housekeeping? N N    Patient Care Team: Nelwyn Salisbury, MD as PCP - General Reinaldo Raddle, Encarnacion Slates, [facility] (Inactive) as Pharmacist (Pharmacist)  Indicate any recent Medical Services you may have received from other than [facility] providers in the past year (date may be approximate).     Assessment:   This is a routine wellness examination for [place].  Hearing/Vision screen Hearing Screening - Comments:: Denies hearing difficulties   Vision Screening - Comments:: Wears rx glasses - up to date with routine eye exams with  [facility]   Goals Addressed               This Visit's Progress     Increase physical activity (pt-stated)        Stay Healthy.       Depression Screen    05/10/2023   11:04 AM 08/03/2022    9:10 AM 08/03/2022    8:33 AM  04/12/2022   10:51 AM 01/31/2022    9:32 AM 11/29/2021   12:46 PM 08/02/2021    8:38 AM  PHQ 2/9 Scores  PHQ - 2 Score 0 0 0 0 0 0 0  PHQ- 9 Score  1 0 3 2  5     Fall Risk    05/09/2023    1:56 PM 02/01/2023    7:39 AM 12/28/2022   10:59 AM 08/03/2022    9:11 AM 08/03/2022    8:33 AM  Fall Risk   Falls in the past year? 0 0 0 0 0  Number falls in past yr: 0   0 0  Injury with Fall? 0   0 0  Risk for fall due to : No Fall Risks   No Fall Risks No Fall Risks  Follow up Falls prevention discussed   Falls evaluation completed Falls evaluation completed    MEDICARE RISK AT HOME: Medicare Risk at Home Any stairs in or around the home?: Yes If so, are there any without handrails?: No Home free of loose throw rugs in walkways, pet beds, electrical cords, etc?: Yes Adequate lighting in your home to reduce risk of falls?: Yes Life alert?: No Use of a cane, walker or w/c?: No Grab bars in the bathroom?: Yes Shower chair or bench in shower?: Yes Elevated toilet seat or a handicapped toilet?: Yes  TIMED UP AND GO:  Was the test performed?  Yes  Length of time to ambulate 10 feet: 10 sec Gait steady and fast without use of assistive device    Cognitive Function:        05/10/2023   11:09 AM 11/29/2021   12:54 PM  6CIT Screen  What Year? 0  points 0 points  What month? 0 points 0 points  What time? 0 points 0 points  Count back from 20 0 points 0 points  Months in reverse 0 points 0 points  Repeat phrase 0 points 0 points  Total Score 0 points 0 points    Immunizations Immunization History  Administered Date(s) Administered   Fluad Quad(high Dose 65+) 04/30/2019, 05/25/2020, 05/09/2023   Influenza Split 06/09/2011, 06/11/2012   Influenza Whole 05/28/2007, 06/03/2008, 06/05/2009, 06/07/2010   Influenza, High Dose Seasonal PF 06/18/2015, 06/21/2016, 07/10/2017, 07/16/2018, 05/17/2021   Influenza,inj,Quad PF,6+ Mos 06/14/2013, 06/16/2014   PFIZER(Purple Top)SARS-COV-2  Vaccination 09/27/2019, 10/23/2019, 06/22/2020, 01/05/2021   Pfizer Covid-19 Vaccine Bivalent Booster 40yrs & up 06/09/2021   Pneumococcal Conjugate-13 06/18/2015   Pneumococcal Polysaccharide-23 05/18/2006, 06/11/2012   Td 04/22/2005   Tdap 06/18/2015   Zoster Recombinant(Shingrix) 01/20/2018, 04/07/2018   Zoster, Live 06/25/2012    TDAP status: Up to date  Flu Vaccine status: Up to date  Pneumococcal vaccine status: Up to date  Covid-19 vaccine status: Declined, Education has been provided regarding the importance of this vaccine but patient still declined. Advised may receive this vaccine at local pharmacy or Health Dept.or vaccine clinic. Aware to provide a copy of the vaccination record if obtained from local pharmacy or Health Dept. Verbalized acceptance and understanding.  Qualifies for Shingles Vaccine? Yes   Zostavax completed Yes   Shingrix Completed?: Yes  Screening Tests Health Maintenance  Topic Date Due   COVID-19 Vaccine (7 - 2023-24 season) 04/23/2023   Medicare Annual Wellness (AWV)  05/09/2024   DTaP/Tdap/Td (3 - Td or Tdap) 06/17/2025   Pneumonia Vaccine 76+ Years old  Completed   INFLUENZA VACCINE  Completed   DEXA SCAN  Completed   Hepatitis C Screening  Completed   Zoster Vaccines- Shingrix  Completed   HPV VACCINES  Aged Out   Colonoscopy  Discontinued    Health Maintenance  Health Maintenance Due  Topic Date Due   COVID-19 Vaccine (7 - 2023-24 season) 04/23/2023        Bone Density status: Ordered 08/03/22. Pt provided with contact info and advised to call to schedule appt.    Additional Screening:  Hepatitis C Screening: does qualify; Completed 06/21/20  Vision Screening: Recommended annual ophthalmology exams for early detection of glaucoma and other disorders of the eye. Is the patient up to date with their annual eye exam?  Yes  Who is the provider or what is the name of the office in which the patient attends annual eye exams?  [facility] If pt is not established with a provider, would they like to be referred to a provider to establish care? No .   Dental Screening: Recommended annual dental exams for proper oral hygiene    Community Resource Referral / Chronic Care Management:  CRR required this visit?  No   CCM required this visit?  No     Plan:     I have personally reviewed and noted the following in the patient's chart:   Medical and social history Use of alcohol, tobacco or illicit drugs  Current medications and supplements including opioid prescriptions. Patient is not currently taking opioid prescriptions. Functional ability and status Nutritional status Physical activity Advanced directives List of other physicians Hospitalizations, surgeries, and ER visits in previous 12 months Vitals Screenings to include cognitive, depression, and falls Referrals and appointments  In addition, I have reviewed and discussed with patient certain preventive protocols, quality metrics, and best practice recommendations. A  written personalized care plan for preventive services as well as general preventive health recommendations were provided to patient.     Tillie Rung, LPN   6/60/6301   After Visit Summary: Given  Nurse Notes: None

## 2023-05-29 DIAGNOSIS — Z23 Encounter for immunization: Secondary | ICD-10-CM | POA: Diagnosis not present

## 2023-06-12 ENCOUNTER — Ambulatory Visit (INDEPENDENT_AMBULATORY_CARE_PROVIDER_SITE_OTHER)
Admission: RE | Admit: 2023-06-12 | Discharge: 2023-06-12 | Disposition: A | Discharge status: Home / Self Care | Payer: Medicare Other | Source: Ambulatory Visit | Attending: Family Medicine | Admitting: Family Medicine

## 2023-06-12 DIAGNOSIS — M810 Age-related osteoporosis without current pathological fracture: Secondary | ICD-10-CM | POA: Diagnosis not present

## 2023-06-14 ENCOUNTER — Telehealth: Payer: Self-pay | Admitting: Family Medicine

## 2023-06-14 ENCOUNTER — Other Ambulatory Visit: Payer: Self-pay | Admitting: Family Medicine

## 2023-06-14 NOTE — Telephone Encounter (Signed)
Duplicate

## 2023-06-14 NOTE — Telephone Encounter (Signed)
Duplicate note

## 2023-06-16 ENCOUNTER — Other Ambulatory Visit: Payer: Self-pay

## 2023-06-16 MED ORDER — ALENDRONATE SODIUM 35 MG PO TABS: 35.0000 mg | ORAL_TABLET | ORAL | 1 refills | Status: DC

## 2023-06-28 ENCOUNTER — Ambulatory Visit: Payer: Medicare Other

## 2023-06-28 ENCOUNTER — Ambulatory Visit (INDEPENDENT_AMBULATORY_CARE_PROVIDER_SITE_OTHER): Payer: Medicare Other | Admitting: Family Medicine

## 2023-06-28 VITALS — BP 124/68 | HR 96 | Temp 98.7°F | Wt 150.0 lb

## 2023-06-28 DIAGNOSIS — M25561 Pain in right knee: Secondary | ICD-10-CM

## 2023-06-28 DIAGNOSIS — G8929 Other chronic pain: Secondary | ICD-10-CM

## 2023-06-28 DIAGNOSIS — M1711 Unilateral primary osteoarthritis, right knee: Secondary | ICD-10-CM | POA: Diagnosis not present

## 2023-06-28 NOTE — Progress Notes (Signed)
   Subjective:    Patient ID: Nesha Counihan, female    DOB: 05/08/1946, 77 y.o.   MRN: [id number]  HPI Here for pain in the right knee which has persisted since she struck it against a wooden chest. That night at home she got up to go to the bathroom in the dark, and ran into the chest. She has had swelling and mild pain in the medial knee ever since. She has applied ice and taken Ibuprofen, and these help a little. She is still able to participate in her water aerobics classes.    Review of Systems  Constitutional: Negative.   Respiratory: Negative.    Cardiovascular: Negative.   Musculoskeletal:  Positive for arthralgias.       Objective:   Physical Exam Constitutional:      General: She is not in acute distress.    Comments: She walks normally   Cardiovascular:     Rate and Rhythm: Normal rate and regular rhythm.     Pulses: Normal pulses.     Heart sounds: Normal heart sounds.  Pulmonary:     Effort: Pulmonary effort is normal.     Breath sounds: Normal breath sounds.  Musculoskeletal:     Comments: Right knee is mildly swollen on the medial side, and she is tender over the medial joint space. ROM is full  Neurological:     Mental Status: She is alert.           Assessment & Plan:  Right knee pain. We will get Xrays today and go from there.  Gershon Crane, MD

## 2023-07-14 ENCOUNTER — Ambulatory Visit (INDEPENDENT_AMBULATORY_CARE_PROVIDER_SITE_OTHER): Payer: Medicare Other | Admitting: Family Medicine

## 2023-07-14 ENCOUNTER — Encounter: Payer: Self-pay | Admitting: Family Medicine

## 2023-07-14 VITALS — BP 132/80 | HR 75 | Temp 98.1°F | Wt 149.0 lb

## 2023-07-14 DIAGNOSIS — R590 Localized enlarged lymph nodes: Secondary | ICD-10-CM | POA: Diagnosis not present

## 2023-07-14 NOTE — Progress Notes (Signed)
   Subjective:    Patient ID: Rachael Mitchell, female    DOB: [birth year], 77 y.o.   MRN: [id number]  HPI Here to check a tender lump under her left jaw. This swelled up suddenly 2 days ago, and it was very palpable at first and quite tender. Now in the last 24 hours, the tenderness is going away and the lump is much smaller. Other than that she feels fine. No fever or URI symptoms. No tooth pain. No recent dental work.    Review of Systems  Constitutional: Negative.   HENT: Negative.    Eyes: Negative.   Respiratory: Negative.    Hematological:  Positive for adenopathy.       Objective:   Physical Exam Constitutional:      Appearance: Normal appearance.  HENT:     Right Ear: Tympanic membrane, ear canal and external ear normal.     Left Ear: Tympanic membrane, ear canal and external ear normal.     Nose: Nose normal.     Mouth/Throat:     Pharynx: Oropharynx is clear.  Eyes:     Conjunctiva/sclera: Conjunctivae normal.  Neck:     Comments: There are no palpable AC nodes, but the area beneath the left jaw is mildly tender  Pulmonary:     Effort: Pulmonary effort is normal.     Breath sounds: Normal breath sounds.  Neurological:     Mental Status: She is alert.           Assessment & Plan:  Isolated reactive AC lymph node. The etiology of this is not clear, but it seems to be resolving on its own. She monitor this, and she will return if it is still symptomatic in 10 days.  Gershon Crane, MD

## 2023-08-07 ENCOUNTER — Ambulatory Visit (INDEPENDENT_AMBULATORY_CARE_PROVIDER_SITE_OTHER): Payer: Medicare Other | Admitting: Family Medicine

## 2023-08-07 ENCOUNTER — Encounter: Payer: Self-pay | Admitting: Family Medicine

## 2023-08-07 VITALS — BP 120/64 | HR 86 | Temp 98.0°F | Ht 66.0 in | Wt 147.0 lb

## 2023-08-07 DIAGNOSIS — M150 Primary generalized (osteo)arthritis: Secondary | ICD-10-CM | POA: Diagnosis not present

## 2023-08-07 DIAGNOSIS — M810 Age-related osteoporosis without current pathological fracture: Secondary | ICD-10-CM | POA: Insufficient documentation

## 2023-08-07 DIAGNOSIS — I10 Essential (primary) hypertension: Secondary | ICD-10-CM | POA: Diagnosis not present

## 2023-08-07 DIAGNOSIS — E559 Vitamin D deficiency, unspecified: Secondary | ICD-10-CM

## 2023-08-07 DIAGNOSIS — E782 Mixed hyperlipidemia: Secondary | ICD-10-CM | POA: Diagnosis not present

## 2023-08-07 DIAGNOSIS — G43009 Migraine without aura, not intractable, without status migrainosus: Secondary | ICD-10-CM

## 2023-08-07 DIAGNOSIS — R739 Hyperglycemia, unspecified: Secondary | ICD-10-CM

## 2023-08-07 LAB — HEMOGLOBIN A1C: Hgb A1c MFr Bld: 5.7 % (ref 4.6–6.5)

## 2023-08-07 LAB — HEPATIC FUNCTION PANEL
ALT: 21 U/L (ref 0–35)
AST: 24 U/L (ref 0–37)
Albumin: 4.7 g/dL (ref 3.5–5.2)
Alkaline Phosphatase: 57 U/L (ref 39–117)
Bilirubin, Direct: 0.1 mg/dL (ref 0.0–0.3)
Total Bilirubin: 0.5 mg/dL (ref 0.2–1.2)
Total Protein: 7.2 g/dL (ref 6.0–8.3)

## 2023-08-07 LAB — BASIC METABOLIC PANEL
BUN: 15 mg/dL (ref 6–23)
CO2: 28 meq/L (ref 19–32)
Calcium: 10.2 mg/dL (ref 8.4–10.5)
Chloride: 100 meq/L (ref 96–112)
Creatinine, Ser: 0.75 mg/dL (ref 0.40–1.20)
GFR: 76.32 mL/min (ref >60.00)
Glucose, Bld: 107 mg/dL — ABNORMAL HIGH (ref 70–99)
Potassium: 4.5 meq/L (ref 3.5–5.1)
Sodium: 139 meq/L (ref 135–145)

## 2023-08-07 LAB — CBC WITH DIFFERENTIAL/PLATELET
Basophils Absolute: 0.1 10*3/uL (ref 0.0–0.1)
Basophils Relative: 1 % (ref 0.0–3.0)
Eosinophils Absolute: 0.3 10*3/uL (ref 0.0–0.7)
Eosinophils Relative: 3.9 % (ref 0.0–5.0)
HCT: 40.5 % (ref 36.0–46.0)
Hemoglobin: 14.1 g/dL (ref 12.0–15.0)
Lymphocytes Relative: 41.1 % (ref 12.0–46.0)
Lymphs Abs: 3.2 10*3/uL (ref 0.7–4.0)
MCHC: 34.8 g/dL (ref 30.0–36.0)
MCV: 91.2 fL (ref 78.0–100.0)
Monocytes Absolute: 0.6 10*3/uL (ref 0.1–1.0)
Monocytes Relative: 7.6 % (ref 3.0–12.0)
Neutro Abs: 3.6 10*3/uL (ref 1.4–7.7)
Neutrophils Relative %: 46.4 % (ref 43.0–77.0)
Platelets: 442 10*3/uL — ABNORMAL HIGH (ref 150.0–400.0)
RBC: 4.44 Mil/uL (ref 3.87–5.11)
RDW: 12.1 % (ref 11.5–15.5)
WBC: 7.8 10*3/uL (ref 4.0–10.5)

## 2023-08-07 LAB — LIPID PANEL
Cholesterol: 129 mg/dL (ref 0–200)
HDL: 45.2 mg/dL (ref >39.00)
LDL Cholesterol: 66 mg/dL (ref 0–99)
NonHDL: 83.3
Total CHOL/HDL Ratio: 3
Triglycerides: 85 mg/dL (ref 0.0–149.0)
VLDL: 17 mg/dL (ref 0.0–40.0)

## 2023-08-07 LAB — VITAMIN D 25 HYDROXY (VIT D DEFICIENCY, FRACTURES): VITD: 53.92 ng/mL (ref 30.00–100.00)

## 2023-08-07 LAB — TSH: TSH: 1.37 u[IU]/mL (ref 0.35–5.50)

## 2023-08-07 MED ORDER — FENOFIBRATE 160 MG PO TABS: 160.0000 mg | ORAL_TABLET | Freq: Every day | ORAL | 3 refills | Status: DC

## 2023-08-07 MED ORDER — ATORVASTATIN CALCIUM 20 MG PO TABS: 20.0000 mg | ORAL_TABLET | Freq: Every day | ORAL | 3 refills | Status: DC

## 2023-08-07 MED ORDER — AMLODIPINE BESYLATE 5 MG PO TABS: 5.0000 mg | ORAL_TABLET | Freq: Every day | ORAL | 3 refills | Status: DC

## 2023-08-07 MED ORDER — TRIAMTERENE-HCTZ 37.5-25 MG PO TABS: 1.0000 | ORAL_TABLET | Freq: Every day | ORAL | 3 refills | Status: DC

## 2023-08-07 MED ORDER — SUMATRIPTAN SUCCINATE 100 MG PO TABS
100.0000 mg | ORAL_TABLET | Freq: Once | ORAL | 3 refills | Status: AC | PRN
Start: 2023-08-07 — End: ?

## 2023-08-07 NOTE — Progress Notes (Signed)
Subjective:    Patient ID: Redina Bonello, female    DOB: [birth year], 77 y.o.   MRN: [id number]  HPI Here for a well exam. She feels fine other than her arthritis. Her BP has been stable. She only gets an occasional migraine if the sun gets in her eyes. She was here a few weeks ago for an enlarged lymph node in the left neck, but this went away as we expected. She started on Alendronate earlier this year for osteoporosis, as seen on a DEXA.    Review of Systems  Constitutional: Negative.   HENT: Negative.    Eyes: Negative.   Respiratory: Negative.    Cardiovascular: Negative.   Gastrointestinal: Negative.   Genitourinary:  Negative for decreased urine volume, difficulty urinating, dyspareunia, dysuria, enuresis, flank pain, frequency, hematuria, pelvic pain and urgency.  Musculoskeletal: Negative.   Skin: Negative.   Neurological: Negative.  Negative for headaches.  Psychiatric/Behavioral: Negative.          Objective:   Physical Exam Constitutional:      General: She is not in acute distress.    Appearance: She is well-developed.  HENT:     Head: Normocephalic and atraumatic.     Right Ear: External ear normal.     Left Ear: External ear normal.     Nose: Nose normal.     Mouth/Throat:     Pharynx: No oropharyngeal exudate.  Eyes:     General: No scleral icterus.    Conjunctiva/sclera: Conjunctivae normal.     Pupils: Pupils are equal, round, and reactive to light.  Neck:     Thyroid: No thyromegaly.     Vascular: No JVD.  Cardiovascular:     Rate and Rhythm: Normal rate and regular rhythm.     Pulses: Normal pulses.     Heart sounds: Normal heart sounds. No murmur heard.    No friction rub. No gallop.  Pulmonary:     Effort: Pulmonary effort is normal. No respiratory distress.     Breath sounds: Normal breath sounds. No wheezing or rales.  Chest:     Chest wall: No tenderness.  Abdominal:     General: Bowel sounds are normal. There is no distension.      Palpations: Abdomen is soft. There is no mass.     Tenderness: There is no abdominal tenderness. There is no guarding or rebound.  Genitourinary:    Comments: Breasts and axilla are normal  Musculoskeletal:        General: No tenderness. Normal range of motion.     Cervical back: Normal range of motion and neck supple.  Lymphadenopathy:     Cervical: No cervical adenopathy.  Skin:    General: Skin is warm and dry.     Findings: No erythema or rash.  Neurological:     General: No focal deficit present.     Mental Status: She is alert and oriented to person, place, and time.     Cranial Nerves: No cranial nerve deficit.     Motor: No abnormal muscle tone.     Coordination: Coordination normal.     Deep Tendon Reflexes: Reflexes are normal and symmetric. Reflexes normal.  Psychiatric:        Mood and Affect: Mood normal.        Behavior: Behavior normal.        Thought Content: Thought content normal.        Judgment: Judgment normal.  Assessment & Plan:  Her HTN is well controlled. Her OA is stable. Her migraines are stable. Get fasting labs to check lipids, vitamin D, etc.   Gershon Crane, MD

## 2023-10-16 DIAGNOSIS — Z85828 Personal history of other malignant neoplasm of skin: Secondary | ICD-10-CM | POA: Diagnosis not present

## 2023-10-16 DIAGNOSIS — L814 Other melanin hyperpigmentation: Secondary | ICD-10-CM | POA: Diagnosis not present

## 2023-10-16 DIAGNOSIS — Z08 Encounter for follow-up examination after completed treatment for malignant neoplasm: Secondary | ICD-10-CM | POA: Diagnosis not present

## 2023-10-16 DIAGNOSIS — D225 Melanocytic nevi of trunk: Secondary | ICD-10-CM | POA: Diagnosis not present

## 2023-10-16 DIAGNOSIS — L821 Other seborrheic keratosis: Secondary | ICD-10-CM | POA: Diagnosis not present

## 2023-11-29 ENCOUNTER — Other Ambulatory Visit: Payer: Self-pay | Admitting: Family Medicine

## 2023-12-20 ENCOUNTER — Other Ambulatory Visit: Payer: Self-pay | Admitting: Family Medicine

## 2023-12-20 DIAGNOSIS — Z1231 Encounter for screening mammogram for malignant neoplasm of breast: Secondary | ICD-10-CM

## 2023-12-22 DIAGNOSIS — Z23 Encounter for immunization: Secondary | ICD-10-CM | POA: Diagnosis not present

## 2024-01-08 ENCOUNTER — Ambulatory Visit

## 2024-01-12 ENCOUNTER — Ambulatory Visit
Admission: RE | Admit: 2024-01-12 | Discharge: 2024-01-12 | Disposition: A | Discharge status: Home / Self Care | Source: Ambulatory Visit | Attending: Family Medicine | Admitting: Family Medicine

## 2024-01-12 DIAGNOSIS — Z1231 Encounter for screening mammogram for malignant neoplasm of breast: Secondary | ICD-10-CM

## 2024-02-05 ENCOUNTER — Encounter: Payer: Self-pay | Admitting: Family Medicine

## 2024-02-05 ENCOUNTER — Ambulatory Visit (INDEPENDENT_AMBULATORY_CARE_PROVIDER_SITE_OTHER): Payer: Medicare Other | Admitting: Family Medicine

## 2024-02-05 VITALS — BP 138/60 | HR 73 | Temp 98.5°F | Wt 150.6 lb

## 2024-02-05 DIAGNOSIS — G8929 Other chronic pain: Secondary | ICD-10-CM | POA: Insufficient documentation

## 2024-02-05 DIAGNOSIS — I10 Essential (primary) hypertension: Secondary | ICD-10-CM

## 2024-02-05 DIAGNOSIS — M25561 Pain in right knee: Secondary | ICD-10-CM | POA: Diagnosis not present

## 2024-02-05 NOTE — Progress Notes (Signed)
   Subjective:    Patient ID: Rachael Mitchell, female    DOB: [birth year], 78 y.o.   MRN: [id number]  HPI Here to follow up on HTN and right knee pain. Her BP has been stable at home. The knee pain has been steadily getting worse. Xrays have demonstrated advanced degenerative changes.    Review of Systems  Constitutional: Negative.   Respiratory: Negative.    Cardiovascular: Negative.   Musculoskeletal:  Positive for arthralgias.       Objective:   Physical Exam Constitutional:      Appearance: Normal appearance.   Cardiovascular:     Rate and Rhythm: Normal rate and regular rhythm.     Pulses: Normal pulses.     Heart sounds: Normal heart sounds.  Pulmonary:     Effort: Pulmonary effort is normal.     Breath sounds: Normal breath sounds.   Neurological:     Mental Status: She is alert.           Assessment & Plan:  Her HTN is well controlled. We will refer her to Orthopedics for the right knee pain.  Corita Diego, MD

## 2024-02-13 ENCOUNTER — Ambulatory Visit (INDEPENDENT_AMBULATORY_CARE_PROVIDER_SITE_OTHER): Admitting: Orthopaedic Surgery

## 2024-02-13 DIAGNOSIS — M1711 Unilateral primary osteoarthritis, right knee: Secondary | ICD-10-CM | POA: Insufficient documentation

## 2024-02-13 MED ORDER — METHYLPREDNISOLONE ACETATE 40 MG/ML IJ SUSP
40.0000 mg | INTRAMUSCULAR | Status: AC | PRN
  Administered 2024-02-13: 40 mg via INTRA_ARTICULAR

## 2024-02-13 MED ORDER — BUPIVACAINE HCL 0.5 % IJ SOLN
2.0000 mL | INTRAMUSCULAR | Status: AC | PRN
  Administered 2024-02-13: 2 mL via INTRA_ARTICULAR

## 2024-02-13 MED ORDER — LIDOCAINE HCL 1 % IJ SOLN
2.0000 mL | INTRAMUSCULAR | Status: AC | PRN
  Administered 2024-02-13: 2 mL

## 2024-02-13 NOTE — Progress Notes (Signed)
 Office Visit Note   Patient: Rachael Mitchell           Date of Birth: [birth year]           MRN: [id number] Visit Date: 02/13/2024              Requested by: Johnny Garnette LABOR, MD [address] [place],  [place] [address] PCP: Johnny Garnette LABOR, MD   Assessment & Plan: Visit Diagnoses:  1. Primary osteoarthritis of right knee     Plan: Impression is right knee pain probable osteoarthritis.  Treatment options discussed.  Elected for cortisone injection today.  She tolerated this well.  Will see her back as needed.  Discussed this could be medial meniscus tear and if the cortisone injection is ineffective she should follow-up for reevaluation.  Could consider advanced imaging.  Follow-Up Instructions: No follow-ups on file.   Orders:  Orders Placed This Encounter  Procedures   Large Joint Inj   No orders of the defined types were placed in this encounter.     Procedures: Large Joint Inj: R knee on 02/13/2024 4:22 PM Indications: pain Details: 22 G needle  Arthrogram: No  Medications: 40 mg methylPREDNISolone acetate 40 MG/ML; 2 mL lidocaine 1 %; 2 mL bupivacaine 0.5 % Consent was given by the patient. Patient was prepped and draped in the usual sterile fashion.     Subjective: Chief Complaint  Patient presents with   Right Knee - Pain    HPI Patient is a 78 year old female with right knee pain since August of last year.  Reports popping slipping and swelling.  Denies any mechanical symptoms.  Wakes up with pain.  Using Voltaren  gel that has now stopped working.  It is tender on the medial side.  Denies any falls or trauma. Review of Systems  Constitutional: Negative.   HENT: Negative.    Eyes: Negative.   Respiratory: Negative.    Cardiovascular: Negative.   Endocrine: Negative.   Musculoskeletal: Negative.   Neurological: Negative.   Hematological: Negative.   Psychiatric/Behavioral: Negative.    All other systems reviewed and are  negative.    Objective: Vital Signs: There were no vitals taken for this visit.  Physical Exam Vitals and nursing note reviewed.  Constitutional:      Appearance: She is well-developed.  HENT:     Head: Atraumatic.     Nose: Nose normal.   Eyes:     Extraocular Movements: Extraocular movements intact.    Cardiovascular:     Pulses: Normal pulses.  Pulmonary:     Effort: Pulmonary effort is normal.  Abdominal:     Palpations: Abdomen is soft.   Musculoskeletal:     Cervical back: Neck supple.   Skin:    General: Skin is warm.     Capillary Refill: Capillary refill takes less than 2 seconds.   Neurological:     Mental Status: She is alert. Mental status is at baseline.   Psychiatric:        Behavior: Behavior normal.        Thought Content: Thought content normal.        Judgment: Judgment normal.    Exam of the right knee shows tenderness along the medial side.  No joint effusion.  Functional range of motion.  Collaterals and cruciates are stable.  PMFS History: Patient Active Problem List   Diagnosis Date Noted   Primary osteoarthritis of right knee 02/13/2024   Chronic pain of right knee 02/05/2024  Osteoporosis 08/07/2023   Sacral fracture, closed (HCC) 08/04/2021   Hyperglycemia 12/19/2016   Migraine 06/07/2010   Vitamin D  deficiency 12/01/2008   Allergic rhinitis 12/01/2008   SKIN CANCER, HX OF 05/28/2007   Hyperlipidemia 04/17/2007   Essential hypertension 04/17/2007   Past Medical History:  Diagnosis Date   Allergy     Arthritis    Cancer (HCC)    basal cell CA- face and back   Hyperlipidemia    Hypertension    Migraine    Osteopenia    last DEXA 06-09-11   Vitamin D  deficiency     Family History  Problem Relation Age of Onset   Heart failure Brother    Diabetes Father    Arthritis Other    Coronary artery disease Other    Hypertension Other    Lung cancer Other    Osteoporosis Other    Heart failure Other    Colon cancer Neg Hx     Esophageal cancer Neg Hx    Stomach cancer Neg Hx    Rectal cancer Neg Hx     Past Surgical History:  Procedure Laterality Date   BASAL CELL CARCINOMA EXCISION     face back   CATARACT EXTRACTION, BILATERAL     colonoscopy  03/20/2023   per Dr. Albertus, diverticulosis, no polyps, no repeats   polpectomy  2007   colon-in [place].   POLYPECTOMY     Social History   Occupational History   Not on file  Tobacco Use   Smoking status: Former    Current packs/day: 0.00    Types: Cigarettes    Quit date: 09/24/1992    Years since quitting: 31.4   Smokeless tobacco: Never  Vaping Use   Vaping status: Never Used  Substance and Sexual Activity   Alcohol use: Yes    Alcohol/week: 3.0 standard drinks of alcohol    Types: 3 Standard drinks or equivalent per week    Comment: 3 glasses of wine a week   Drug use: No   Sexual activity: Not on file

## 2024-04-03 DIAGNOSIS — H18593 Other hereditary corneal dystrophies, bilateral: Secondary | ICD-10-CM | POA: Diagnosis not present

## 2024-04-03 DIAGNOSIS — H35372 Puckering of macula, left eye: Secondary | ICD-10-CM | POA: Diagnosis not present

## 2024-04-03 DIAGNOSIS — H53143 Visual discomfort, bilateral: Secondary | ICD-10-CM | POA: Diagnosis not present

## 2024-04-03 DIAGNOSIS — H43393 Other vitreous opacities, bilateral: Secondary | ICD-10-CM | POA: Diagnosis not present

## 2024-04-15 DIAGNOSIS — L821 Other seborrheic keratosis: Secondary | ICD-10-CM | POA: Diagnosis not present

## 2024-04-15 DIAGNOSIS — Z85828 Personal history of other malignant neoplasm of skin: Secondary | ICD-10-CM | POA: Diagnosis not present

## 2024-04-15 DIAGNOSIS — Z08 Encounter for follow-up examination after completed treatment for malignant neoplasm: Secondary | ICD-10-CM | POA: Diagnosis not present

## 2024-04-15 DIAGNOSIS — L820 Inflamed seborrheic keratosis: Secondary | ICD-10-CM | POA: Diagnosis not present

## 2024-04-15 DIAGNOSIS — L538 Other specified erythematous conditions: Secondary | ICD-10-CM | POA: Diagnosis not present

## 2024-04-15 DIAGNOSIS — Z789 Other specified health status: Secondary | ICD-10-CM | POA: Diagnosis not present

## 2024-04-15 DIAGNOSIS — D225 Melanocytic nevi of trunk: Secondary | ICD-10-CM | POA: Diagnosis not present

## 2024-04-15 DIAGNOSIS — L2989 Other pruritus: Secondary | ICD-10-CM | POA: Diagnosis not present

## 2024-04-15 DIAGNOSIS — D485 Neoplasm of uncertain behavior of skin: Secondary | ICD-10-CM | POA: Diagnosis not present

## 2024-04-15 DIAGNOSIS — L814 Other melanin hyperpigmentation: Secondary | ICD-10-CM | POA: Diagnosis not present

## 2024-04-15 DIAGNOSIS — R208 Other disturbances of skin sensation: Secondary | ICD-10-CM | POA: Diagnosis not present

## 2024-04-15 DIAGNOSIS — C44612 Basal cell carcinoma of skin of right upper limb, including shoulder: Secondary | ICD-10-CM | POA: Diagnosis not present

## 2024-05-10 ENCOUNTER — Other Ambulatory Visit: Payer: Self-pay | Admitting: Family Medicine

## 2024-05-13 ENCOUNTER — Encounter: Payer: Self-pay | Admitting: Family Medicine

## 2024-05-13 ENCOUNTER — Ambulatory Visit (INDEPENDENT_AMBULATORY_CARE_PROVIDER_SITE_OTHER): Admitting: Family Medicine

## 2024-05-13 VITALS — BP 134/60 | HR 72 | Temp 98.2°F | Wt 155.4 lb

## 2024-05-13 DIAGNOSIS — I10 Essential (primary) hypertension: Secondary | ICD-10-CM

## 2024-05-13 DIAGNOSIS — L905 Scar conditions and fibrosis of skin: Secondary | ICD-10-CM | POA: Diagnosis not present

## 2024-05-13 DIAGNOSIS — Z23 Encounter for immunization: Secondary | ICD-10-CM | POA: Diagnosis not present

## 2024-05-13 DIAGNOSIS — C44612 Basal cell carcinoma of skin of right upper limb, including shoulder: Secondary | ICD-10-CM | POA: Diagnosis not present

## 2024-05-13 NOTE — Progress Notes (Signed)
   Subjective:    Patient ID: Rachael Mitchell, female    DOB: 1945/09/02, 78 y.o.   MRN: [id number]  HPI Here to follow up on HTN. Her BP has been stable, and she has felt well. She went on a cruise in May.   Review of Systems  Constitutional: Negative.   Respiratory: Negative.    Cardiovascular: Negative.        Objective:   Physical Exam Constitutional:      Appearance: Normal appearance.  Cardiovascular:     Rate and Rhythm: Normal rate and regular rhythm.     Pulses: Normal pulses.     Heart sounds: Normal heart sounds.  Pulmonary:     Effort: Pulmonary effort is normal.     Breath sounds: Normal breath sounds.  Neurological:     Mental Status: She is alert.           Assessment & Plan:  HTN is stable. She will return in December for our annual labs and checkup.  Garnette Olmsted, MD

## 2024-05-13 NOTE — Addendum Note (Signed)
 Addended by: LADONNA INOCENTE SAILOR on: 05/13/2024 11:03 AM   Modules accepted: Orders

## 2024-06-24 ENCOUNTER — Encounter: Payer: Self-pay | Admitting: Radiology

## 2024-06-24 DIAGNOSIS — Z23 Encounter for immunization: Secondary | ICD-10-CM | POA: Diagnosis not present

## 2024-07-23 ENCOUNTER — Other Ambulatory Visit: Payer: Self-pay | Admitting: Family Medicine

## 2024-07-23 DIAGNOSIS — I10 Essential (primary) hypertension: Secondary | ICD-10-CM

## 2024-07-23 DIAGNOSIS — E782 Mixed hyperlipidemia: Secondary | ICD-10-CM

## 2024-08-07 ENCOUNTER — Ambulatory Visit: Payer: Self-pay | Admitting: Family Medicine

## 2024-08-07 ENCOUNTER — Ambulatory Visit: Payer: Medicare Other | Admitting: Family Medicine

## 2024-08-07 VITALS — BP 132/78 | HR 71 | Temp 98.5°F | Ht 65.5 in | Wt 152.8 lb

## 2024-08-07 DIAGNOSIS — M810 Age-related osteoporosis without current pathological fracture: Secondary | ICD-10-CM | POA: Diagnosis not present

## 2024-08-07 DIAGNOSIS — E782 Mixed hyperlipidemia: Secondary | ICD-10-CM

## 2024-08-07 DIAGNOSIS — R739 Hyperglycemia, unspecified: Secondary | ICD-10-CM

## 2024-08-07 DIAGNOSIS — E559 Vitamin D deficiency, unspecified: Secondary | ICD-10-CM

## 2024-08-07 DIAGNOSIS — I10 Essential (primary) hypertension: Secondary | ICD-10-CM | POA: Diagnosis not present

## 2024-08-07 LAB — BASIC METABOLIC PANEL WITH GFR
BUN: 21 mg/dL (ref 6–23)
CO2: 28 meq/L (ref 19–32)
Calcium: 9.9 mg/dL (ref 8.4–10.5)
Chloride: 102 meq/L (ref 96–112)
Creatinine, Ser: 0.77 mg/dL (ref 0.40–1.20)
GFR: 73.43 mL/min (ref >60.00)
Glucose, Bld: 106 mg/dL — ABNORMAL HIGH (ref 70–99)
Potassium: 3.9 meq/L (ref 3.5–5.1)
Sodium: 137 meq/L (ref 135–145)

## 2024-08-07 LAB — CBC WITH DIFFERENTIAL/PLATELET
Basophils Absolute: 0.1 K/uL (ref 0.0–0.1)
Basophils Relative: 1.1 % (ref 0.0–3.0)
Eosinophils Absolute: 0.3 K/uL (ref 0.0–0.7)
Eosinophils Relative: 4.1 % (ref 0.0–5.0)
HCT: 38.3 % (ref 36.0–46.0)
Hemoglobin: 13.4 g/dL (ref 12.0–15.0)
Lymphocytes Relative: 42 % (ref 12.0–46.0)
Lymphs Abs: 3.2 K/uL (ref 0.7–4.0)
MCHC: 35 g/dL (ref 30.0–36.0)
MCV: 89.3 fl (ref 78.0–100.0)
Monocytes Absolute: 0.6 K/uL (ref 0.1–1.0)
Monocytes Relative: 7.8 % (ref 3.0–12.0)
Neutro Abs: 3.4 K/uL (ref 1.4–7.7)
Neutrophils Relative %: 45 % (ref 43.0–77.0)
Platelets: 385 K/uL (ref 150.0–400.0)
RBC: 4.29 Mil/uL (ref 3.87–5.11)
RDW: 12.1 % (ref 11.5–15.5)
WBC: 7.6 K/uL (ref 4.0–10.5)

## 2024-08-07 LAB — HEPATIC FUNCTION PANEL
ALT: 23 U/L (ref 3–35)
AST: 26 U/L (ref 5–37)
Albumin: 4.4 g/dL (ref 3.5–5.2)
Alkaline Phosphatase: 41 U/L (ref 39–117)
Bilirubin, Direct: 0.1 mg/dL (ref 0.1–0.3)
Total Bilirubin: 0.4 mg/dL (ref 0.2–1.2)
Total Protein: 7.1 g/dL (ref 6.0–8.3)

## 2024-08-07 LAB — LIPID PANEL
Cholesterol: 115 mg/dL (ref 28–200)
HDL: 39.5 mg/dL (ref >39.00)
LDL Cholesterol: 57 mg/dL (ref 10–99)
NonHDL: 75.2
Total CHOL/HDL Ratio: 3
Triglycerides: 89 mg/dL (ref 10.0–149.0)
VLDL: 17.8 mg/dL (ref 0.0–40.0)

## 2024-08-07 LAB — HEMOGLOBIN A1C: Hgb A1c MFr Bld: 5.7 % (ref 4.6–6.5)

## 2024-08-07 LAB — VITAMIN D 25 HYDROXY (VIT D DEFICIENCY, FRACTURES): VITD: 50.15 ng/mL (ref 30.00–100.00)

## 2024-08-07 LAB — TSH: TSH: 1.22 u[IU]/mL (ref 0.35–5.50)

## 2024-08-07 MED ORDER — SUMATRIPTAN SUCCINATE 100 MG PO TABS: 100.0000 mg | ORAL_TABLET | Freq: Once | ORAL | 5 refills | Status: AC | PRN

## 2024-08-07 NOTE — Progress Notes (Signed)
 Subjective:    Patient ID: Rachael Mitchell, female    DOB: [birth year], 78 y.o.   MRN: [id number]  HPI Here to follow up on issues. She feels well in general. She still has daily pain in the right knee. She received one steroid injection earlier this year per Dr. Jerri, and this was very helpful. She is considering asking him for another one. Her migraines are stable, and she still get good relief with Sumatriptan . Her BP is stable. Her last DEXA was last year.    Review of Systems  Constitutional: Negative.   HENT: Negative.    Eyes: Negative.   Respiratory: Negative.    Cardiovascular: Negative.   Gastrointestinal: Negative.   Genitourinary:  Negative for decreased urine volume, difficulty urinating, dyspareunia, dysuria, enuresis, flank pain, frequency, hematuria, pelvic pain and urgency.  Musculoskeletal:  Positive for arthralgias.  Skin: Negative.   Neurological:  Positive for headaches.  Psychiatric/Behavioral: Negative.         Objective:   Physical Exam Constitutional:      General: She is not in acute distress.    Appearance: Normal appearance. She is well-developed.  HENT:     Head: Normocephalic and atraumatic.     Right Ear: External ear normal.     Left Ear: External ear normal.     Nose: Nose normal.     Mouth/Throat:     Pharynx: No oropharyngeal exudate.  Eyes:     General: No scleral icterus.    Conjunctiva/sclera: Conjunctivae normal.     Pupils: Pupils are equal, round, and reactive to light.  Neck:     Thyroid : No thyromegaly.     Vascular: No JVD.  Cardiovascular:     Rate and Rhythm: Normal rate and regular rhythm.     Heart sounds: Normal heart sounds. No murmur heard.    No friction rub. No gallop.  Pulmonary:     Effort: Pulmonary effort is normal. No respiratory distress.     Breath sounds: Normal breath sounds. No wheezing or rales.     Comments: Both breasts and both axillae are normal  Chest:     Chest wall: No tenderness.  Abdominal:      General: Bowel sounds are normal. There is no distension.     Palpations: Abdomen is soft. There is no mass.     Tenderness: There is no abdominal tenderness. There is no guarding or rebound.  Musculoskeletal:        General: No tenderness. Normal range of motion.     Cervical back: Normal range of motion and neck supple.  Lymphadenopathy:     Cervical: No cervical adenopathy.  Skin:    General: Skin is warm and dry.     Findings: No erythema or rash.  Neurological:     General: No focal deficit present.     Mental Status: She is alert and oriented to person, place, and time.     Cranial Nerves: No cranial nerve deficit.     Motor: No abnormal muscle tone.     Coordination: Coordination normal.     Deep Tendon Reflexes: Reflexes are normal and symmetric. Reflexes normal.  Psychiatric:        Mood and Affect: Mood normal.        Behavior: Behavior normal.        Thought Content: Thought content normal.        Judgment: Judgment normal.           Assessment &  Plan:  Her HTN is well controlled. Her osteoporosis is stable, and we will check another DEXA next year. Her migraines are stable. Her OA is stable, but I encouraged her to contact Dr. Jerri about her right knee. Get labs to check lipids, A1c, etc. I personally spent a total of 32 minutes in the care of the patient today including getting/reviewing separately obtained history, performing a medically appropriate exam/evaluation, and placing orders.  Garnette Olmsted, MD

## 2024-09-02 ENCOUNTER — Ambulatory Visit

## 2024-09-02 VITALS — Ht 65.5 in | Wt 151.2 lb

## 2024-09-02 DIAGNOSIS — Z0000 Encounter for general adult medical examination without abnormal findings: Secondary | ICD-10-CM

## 2024-09-02 NOTE — Progress Notes (Signed)
 "  Chief Complaint  Patient presents with   Medicare Wellness     Subjective:   Rachael Mitchell is a 79 y.o. female who presents for a Medicare Annual Wellness Visit.  Visit info / Clinical Intake: Medicare Wellness Visit Type:: Subsequent Annual Wellness Visit Persons participating in visit and providing information:: patient Medicare Wellness Visit Mode:: Telephone If telephone:: video declined Since this visit was completed virtually, some vitals may be partially provided or unavailable. Missing vitals are due to the limitations of the virtual format.: Documented vitals are patient reported If Telephone or Video please confirm:: I connected with patient using audio/video enable telemedicine. I verified patient identity with two identifiers, discussed telehealth limitations, and patient agreed to proceed. Patient Location:: Home Provider Location:: Office Interpreter Needed?: No Pre-visit prep was completed: yes AWV questionnaire completed by patient prior to visit?: yes Date:: 09/01/24 Living arrangements:: with family/others Patient's Overall Health Status Rating: very good Typical amount of pain: some Does pain affect daily life?: no Are you currently prescribed opioids?: no  Dietary Habits and Nutritional Risks How many meals a day?: 3 Eats fruit and vegetables daily?: yes Most meals are obtained by: preparing own meals; eating out In the last 2 weeks, have you had any of the following?: none Diabetic:: no  Functional Status Activities of Daily Living (to include ambulation/medication): Independent Ambulation: Independent with device- listed below Home Assistive Devices/Equipment: Eyeglasses Medication Administration: Independent Home Management (perform basic housework or laundry): Independent Manage your own finances?: yes Primary transportation is: driving Concerns about vision?: no *vision screening is required for WTM* Concerns about hearing?: no  Fall  Screening Falls in the past year?: 0 Number of falls in past year: 0 Was there an injury with Fall?: 0 Fall Risk Category Calculator: 0 Patient Fall Risk Level: Low Fall Risk  Fall Risk Patient at Risk for Falls Due to: No Fall Risks  Home and Transportation Safety: All rugs have non-skid backing?: yes All stairs or steps have railings?: yes Grab bars in the bathtub or shower?: yes Have non-skid surface in bathtub or shower?: yes Good home lighting?: yes Regular seat belt use?: yes Hospital stays in the last year:: no  Cognitive Assessment Difficulty concentrating, remembering, or making decisions? : no Will 6CIT or Mini Cog be Completed: yes What year is it?: 0 points What month is it?: 0 points Give patient an address phrase to remember (5 components): [address] [place]  About what time is it?: 0 points Count backwards from 20 to 1: 0 points Say the months of the year in reverse: 0 points Repeat the address phrase from earlier: 0 points 6 CIT Score: 0 points  Advance Directives (For Healthcare) Does Patient Have a Medical Advance Directive?: Yes Does patient want to make changes to medical advance directive?: No - Patient declined Type of Advance Directive: Healthcare Power of [place]; Living will Copy of Healthcare Power of Attorney in Chart?: No - copy requested Copy of Living Will in Chart?: No - copy requested  Reviewed/Updated  Reviewed/Updated: Reviewed All (Medical, Surgical, Family, Medications, Allergies, Care Teams, Patient Goals)    Allergies (verified) Patient has no known allergies.   Current Medications (verified) Outpatient Encounter Medications as of 09/02/2024  Medication Sig   alendronate  (FOSAMAX ) 35 MG tablet TAKE 1 TABLET EVERY 7 (SEVEN) DAYS. TAKE WITH A FULL GLASS OF WATER ON AN EMPTY STOMACH.   amLODipine  (NORVASC ) 5 MG tablet TAKE 1 TABLET (5 MG TOTAL) BY MOUTH DAILY.   Ascorbic Acid (  VITAMIN C) 500 MG tablet Take 500 mg by mouth  2 (two) times daily.   atorvastatin  (LIPITOR) 20 MG tablet TAKE 1 TABLET BY MOUTH EVERY DAY   Calcium  Carbonate-Vitamin D  600-400 MG-UNIT tablet Take 1 tablet by mouth 2 (two) times daily.   cholecalciferol (VITAMIN D ) 1000 UNITS tablet Take 2,000 Units by mouth daily. Take 2   diclofenac  Sodium (VOLTAREN ) 1 % GEL Apply 2 g topically 4 (four) times daily.   fenofibrate  160 MG tablet TAKE 1 TABLET BY MOUTH EVERY DAY   fish oil-omega-3 fatty acids 1000 MG capsule Take 1,200 mg by mouth daily.   glucosamine-chondroitin 500-400 MG tablet Take 1 tablet by mouth daily. Takes 1500-1200 mg 1 tablet daily   Levocetirizine Dihydrochloride (XYZAL PO) Take by mouth. OTC   Multiple Vitamin (MULTIVITAMIN) tablet Take 1 tablet by mouth daily.   SUMAtriptan  (IMITREX ) 100 MG tablet Take 1 tablet (100 mg total) by mouth once as needed.   triamcinolone (KENALOG) 0.025 % cream Apply 1 Application topically as needed.   triamterene -hydrochlorothiazide (MAXZIDE-25) 37.5-25 MG tablet TAKE 1 TABLET BY MOUTH EVERY DAY   No facility-administered encounter medications on file as of 09/02/2024.    History: Past Medical History:  Diagnosis Date   Allergy     Arthritis    Cancer (HCC)    basal cell CA- face and back   Hyperlipidemia    Hypertension    Migraine    Osteopenia    last DEXA 06-09-11   Vitamin D  deficiency    Past Surgical History:  Procedure Laterality Date   BASAL CELL CARCINOMA EXCISION     face back   CATARACT EXTRACTION, BILATERAL     colonoscopy  03/20/2023   per Dr. Albertus, diverticulosis, no polyps, no repeats   polpectomy  2007   colon-in [place].   POLYPECTOMY     Family History  Problem Relation Age of Onset   Heart failure Brother    Diabetes Father    Arthritis Other    Coronary artery disease Other    Hypertension Other    Lung cancer Other    Osteoporosis Other    Heart failure Other    Colon cancer Neg Hx    Esophageal cancer Neg Hx    Stomach cancer Neg Hx    Rectal  cancer Neg Hx    Social History   Occupational History   Not on file  Tobacco Use   Smoking status: Former    Current packs/day: 0.00    Types: Cigarettes    Quit date: 09/24/1992    Years since quitting: 31.9   Smokeless tobacco: Never  Vaping Use   Vaping status: Never Used  Substance and Sexual Activity   Alcohol use: Yes    Alcohol/week: 3.0 standard drinks of alcohol    Types: 3 Standard drinks or equivalent per week    Comment: 3 glasses of wine a week   Drug use: No   Sexual activity: Not on file   Tobacco Counseling Counseling given: No  SDOH Screenings   Food Insecurity: No Food Insecurity (09/02/2024)  Housing: Low Risk (09/02/2024)  Transportation Needs: No Transportation Needs (09/02/2024)  Utilities: Not At Risk (09/02/2024)  Alcohol Screen: Low Risk (08/07/2024)  Depression (PHQ2-9): Low Risk (09/02/2024)  Financial Resource Strain: Low Risk (08/07/2024)  Physical Activity: Sufficiently Active (09/02/2024)  Social Connections: Moderately Integrated (09/02/2024)  Recent Concern: Social Connections - Moderately Isolated (08/07/2024)  Stress: No Stress Concern Present (09/02/2024)  Tobacco Use: Medium Risk (  09/02/2024)  Health Literacy: Adequate Health Literacy (09/02/2024)   See flowsheets for full screening details  Depression Screen PHQ 2 & 9 Depression Scale- Over the past 2 weeks, how often have you been bothered by any of the following problems? Little interest or pleasure in doing things: 0 Feeling down, depressed, or hopeless (PHQ Adolescent also includes...irritable): 0 PHQ-2 Total Score: 0     Goals Addressed               This Visit's Progress     Remain active (pt-stated)               Objective:    Today's Vitals   09/02/24 0856  Weight: 151 lb 3.2 oz (68.6 kg)  Height: 5' 5.5 (1.664 m)   Body mass index is 24.78 kg/m.  Hearing/Vision screen Hearing Screening - Comments:: Denies hearing difficulties   Vision Screening -  Comments:: Wears rx glasses - up to date with routine eye exams with  [facility] Immunizations and Health Maintenance Health Maintenance  Topic Date Due   COVID-19 Vaccine (9 - 2025-26 season) 12/22/2024   DTaP/Tdap/Td (3 - Td or Tdap) 06/17/2025   Medicare Annual Wellness (AWV)  09/02/2025   Pneumococcal Vaccine: 50+ Years  Completed   Influenza Vaccine  Completed   Bone Density Scan  Completed   Hepatitis C Screening  Completed   Zoster Vaccines- Shingrix  Completed   Meningococcal B Vaccine  Aged Out   Mammogram  Discontinued   Colonoscopy  Discontinued        Assessment/Plan:  This is a routine wellness examination for Yara.  Patient Care Team: Johnny Garnette LABOR, MD as PCP - General Liane, Sharyne MATSU, [facility] (Inactive) as Pharmacist (Pharmacist)  I have personally reviewed and noted the following in the patients chart:   Medical and social history Use of alcohol, tobacco or illicit drugs  Current medications and supplements including opioid prescriptions. Functional ability and status Nutritional status Physical activity Advanced directives List of other physicians Hospitalizations, surgeries, and ER visits in previous 12 months Vitals Screenings to include cognitive, depression, and falls Referrals and appointments  No orders of the defined types were placed in this encounter.  In addition, I have reviewed and discussed with patient certain preventive protocols, quality metrics, and best practice recommendations. A written personalized care plan for preventive services as well as general preventive health recommendations were provided to patient.   Rojelio LELON Blush, LPN   8/87/7973   Return in 1 year (on 09/05/2025).  After Visit Summary: (MyChart) Due to this being a telephonic visit, the after visit summary with patients personalized plan was offered to patient via MyChart   Nurse Notes: No voiced or noted concerns at this time "

## 2024-09-02 NOTE — Patient Instructions (Addendum)
 Rachael Mitchell,  Thank you for taking the time for your Medicare Wellness Visit. I appreciate your continued commitment to your health goals. Please review the care plan we discussed, and feel free to reach out if I can assist you further.  Please note that Annual Wellness Visits do not include a physical exam. Some assessments may be limited, especially if the visit was conducted virtually. If needed, we may recommend an in-person follow-up with your provider.  Ongoing Care Seeing your primary care provider every 3 to 6 months helps us  monitor your health and provide consistent, personalized care.   Referrals If a referral was made during today's visit and you haven't received any updates within two weeks, please contact the referred provider directly to check on the status.  Recommended Screenings:  Health Maintenance  Topic Date Due   COVID-19 Vaccine (9 - 2025-26 season) 12/22/2024   DTaP/Tdap/Td vaccine (3 - Td or Tdap) 06/17/2025   Medicare Annual Wellness Visit  09/02/2025   Pneumococcal Vaccine for age over 2  Completed   Flu Shot  Completed   Osteoporosis screening with Bone Density Scan  Completed   Hepatitis C Screening  Completed   Zoster (Shingles) Vaccine  Completed   Meningitis B Vaccine  Aged Out   Breast Cancer Screening  Discontinued   Colon Cancer Screening  Discontinued       09/02/2024    8:58 AM  Advanced Directives  Does Patient Have a Medical Advance Directive? Yes  Type of Estate Agent of [place];Living will  Does patient want to make changes to medical advance directive? No - Patient declined  Copy of Healthcare Power of Attorney in Chart? No - copy requested    Vision: Annual vision screenings are recommended for early detection of glaucoma, cataracts, and diabetic retinopathy. These exams can also reveal signs of chronic conditions such as diabetes and high blood pressure.  Dental: Annual dental screenings help detect early signs  of oral cancer, gum disease, and other conditions linked to overall health, including heart disease and diabetes.  Please see the attached documents for additional preventive care recommendations.

## 2025-02-05 ENCOUNTER — Ambulatory Visit: Admitting: Family Medicine

## 2025-08-11 ENCOUNTER — Encounter: Admitting: Family Medicine

## 2025-09-05 ENCOUNTER — Ambulatory Visit
